# Patient Record
Sex: Male | Born: 1950 | Hispanic: No | Marital: Married | Smoking: Never smoker
Health system: Southern US, Community
[De-identification: ages and names within clinical notes are randomized; demographics above are authoritative.]

## PROBLEM LIST (undated history)

## (undated) DIAGNOSIS — I10 Essential (primary) hypertension: Secondary | ICD-10-CM

## (undated) HISTORY — PX: NECK SURGERY: SHX720

---

## 2017-01-14 ENCOUNTER — Emergency Department (HOSPITAL_BASED_OUTPATIENT_CLINIC_OR_DEPARTMENT_OTHER): Payer: Medicaid Other

## 2017-01-14 ENCOUNTER — Emergency Department (HOSPITAL_BASED_OUTPATIENT_CLINIC_OR_DEPARTMENT_OTHER)
Admission: EM | Admit: 2017-01-14 | Discharge: 2017-01-14 | Disposition: A | Discharge status: Home / Self Care | Payer: Medicaid Other | Attending: Emergency Medicine | Admitting: Emergency Medicine

## 2017-01-14 ENCOUNTER — Encounter (HOSPITAL_BASED_OUTPATIENT_CLINIC_OR_DEPARTMENT_OTHER): Payer: Self-pay

## 2017-01-14 DIAGNOSIS — Y929 Unspecified place or not applicable: Secondary | ICD-10-CM | POA: Diagnosis not present

## 2017-01-14 DIAGNOSIS — Y999 Unspecified external cause status: Secondary | ICD-10-CM | POA: Insufficient documentation

## 2017-01-14 DIAGNOSIS — Y939 Activity, unspecified: Secondary | ICD-10-CM | POA: Diagnosis not present

## 2017-01-14 DIAGNOSIS — S93609A Unspecified sprain of unspecified foot, initial encounter: Secondary | ICD-10-CM

## 2017-01-14 DIAGNOSIS — W19XXXA Unspecified fall, initial encounter: Secondary | ICD-10-CM | POA: Diagnosis not present

## 2017-01-14 DIAGNOSIS — S93692A Other sprain of left foot, initial encounter: Secondary | ICD-10-CM | POA: Diagnosis not present

## 2017-01-14 DIAGNOSIS — S99822A Other specified injuries of left foot, initial encounter: Secondary | ICD-10-CM | POA: Diagnosis present

## 2017-01-14 HISTORY — DX: Essential (primary) hypertension: I10

## 2017-01-14 MED ORDER — NAPROXEN 250 MG PO TABS
500.0000 mg | ORAL_TABLET | Freq: Once | ORAL | Status: AC
  Administered 2017-01-14: 500 mg via ORAL
  Filled 2017-01-14: qty 2

## 2017-01-14 MED ORDER — ACETAMINOPHEN 500 MG PO TABS
1000.0000 mg | ORAL_TABLET | Freq: Once | ORAL | Status: AC
  Administered 2017-01-14: 1000 mg via ORAL
  Filled 2017-01-14: qty 2

## 2017-01-14 MED ORDER — NAPROXEN 375 MG PO TABS: 375.0000 mg | ORAL_TABLET | Freq: Two times a day (BID) | ORAL | 0 refills | Status: DC

## 2017-01-14 NOTE — ED Triage Notes (Signed)
Pt tripped down 2 steps at approx 2100 last night. Pt complaining of left foot pain.

## 2017-01-14 NOTE — ED Notes (Signed)
Patient transported to X-ray 

## 2017-01-14 NOTE — ED Notes (Signed)
ED Provider at bedside. 

## 2017-01-14 NOTE — ED Notes (Signed)
Ice pack applied to left ankle.

## 2017-01-14 NOTE — ED Provider Notes (Signed)
[facility] Provider Note   CSN: [id number] Arrival date & time: 01/14/17  0116     History   Chief Complaint Chief Complaint  Patient presents with  . Foot Pain  . Fall    HPI Alex Villanueva is a 66 y.o. male.  The history is provided by the patient. No language interpreter was used.  Foot Pain  This is a new problem. The current episode started 3 to 5 hours ago. The problem occurs constantly. The problem has not changed since onset.Pertinent negatives include no chest pain, no abdominal pain, no headaches and no shortness of breath. The symptoms are aggravated by walking. Nothing relieves the symptoms. He has tried nothing for the symptoms. The treatment provided no relief.  tripped and twisted left ankle and foot.    No past medical history on file.  There are no active problems to display for this patient.   No past surgical history on file.     Home Medications    Prior to Admission medications   Not on File    Family History No family history on file.  Social History Social History  Substance Use Topics  . Smoking status: Not on file  . Smokeless tobacco: Not on file  . Alcohol use Not on file     Allergies   Patient has no allergy information on record.   Review of Systems Review of Systems  Respiratory: Negative for shortness of breath.   Cardiovascular: Negative for chest pain.  Gastrointestinal: Negative for abdominal pain.  Musculoskeletal: Positive for arthralgias. Negative for back pain and gait problem.  Neurological: Negative for headaches.  All other systems reviewed and are negative.    Physical Exam Updated Vital Signs There were no vitals taken for this visit.  Physical Exam  Constitutional: He is oriented to person, place, and time. He appears well-developed and well-nourished. No distress.  HENT:  Head: Normocephalic and atraumatic.  Nose: Nose normal.  Mouth/Throat: Oropharynx is clear and moist.  Eyes:  Conjunctivae are normal. Pupils are equal, round, and reactive to light.  Neck: Normal range of motion.  Cardiovascular: Normal rate, regular rhythm, normal heart sounds and intact distal pulses.   Pulmonary/Chest: He has no wheezes.  Abdominal: Soft. Bowel sounds are normal. There is no tenderness.  Musculoskeletal: Normal range of motion. He exhibits no edema or deformity.       Left ankle: Normal. Achilles tendon normal.       Left foot: Normal. There is normal range of motion, no tenderness, no bony tenderness, no swelling, normal capillary refill, no crepitus, no deformity and no laceration.  Neurological: He is alert and oriented to person, place, and time.  Skin: Skin is dry. Capillary refill takes less than 2 seconds.  Psychiatric: He has a normal mood and affect.  Nursing note and vitals reviewed.    ED Treatments / Results   Vitals:   01/14/17 0135  BP: (!) 176/97  Pulse: 77  Resp: 18  Temp: 97.9 F (36.6 C)    Radiology  No results found for this or any previous visit. Dg Ankle Complete Left  Result Date: 01/14/2017 CLINICAL DATA:  Tripped down 2 steps, with pain and swelling at the left lateral ankle. Initial encounter. EXAM: LEFT ANKLE COMPLETE - 3+ VIEW COMPARISON:  None. FINDINGS: There is no evidence of fracture or dislocation. The ankle mortise is intact; the interosseous space is within normal limits. No talar tilt or subluxation is seen. A posterior calcaneal  spur is noted. The joint spaces are preserved. No significant soft tissue abnormalities are seen. IMPRESSION: No evidence of fracture or dislocation. Electronically Signed   By: Roanna RaiderJeffery  Chang M.D.   On: 01/14/2017 01:59   Dg Foot Complete Left  Result Date: 01/14/2017 CLINICAL DATA:  Tripped down 2 steps, with left lateral foot pain and swelling. Initial encounter. EXAM: LEFT FOOT - COMPLETE 3+ VIEW COMPARISON:  None. FINDINGS: There is no evidence of fracture or dislocation. The joint spaces are preserved.  There is no evidence of talar subluxation; the subtalar joint is unremarkable in appearance. A posterior calcaneal spur is noted. No significant soft tissue abnormalities are seen. IMPRESSION: No evidence of fracture or dislocation. Electronically Signed   By: Roanna RaiderJeffery  Chang M.D.   On: 01/14/2017 01:58    Procedures Procedures (including critical care time)  Medications Ordered in ED  Medications  naproxen (NAPROSYN) tablet 500 mg (not administered)  acetaminophen (TYLENOL) tablet 1,000 mg (1,000 mg Oral Given 01/14/17 0219)     Final Clinical Impressions(s) / ED Diagnoses  Foot sprain:  Return immediately for fever >101, coldness of the extremity, numbness, intractable pain, weakness, bleeding or any concerns. Follow up with your own doctor for ongoing concerns.    The patient is nontoxic-appearing on exam and vital signs are within normal limits.   I have reviewed the triage vital signs and the nursing notes. Pertinent labs &imaging results that were available during my care of the patient were reviewed by me and considered in my medical decision making (see chart for details).  After history, exam, and medical workup I feel the patient has been appropriately medically screened and is safe for discharge home. Pertinent diagnoses were discussed with the patient. Patient was given return precautions.      Logan Vegh, MD 01/14/17 [id number]

## 2017-02-16 ENCOUNTER — Ambulatory Visit: Payer: Medicaid Other | Admitting: Family Medicine

## 2017-02-16 DIAGNOSIS — I10 Essential (primary) hypertension: Secondary | ICD-10-CM | POA: Insufficient documentation

## 2017-02-16 NOTE — Progress Notes (Deleted)
   Subjective:  Patient ID: Rigoberto NoelMohamed Dembeck, male    DOB: 10/26/1950  Age: 66 y.o. MRN: [id number]  CC: No chief complaint on file.   HPI Rigoberto NoelMohamed Stebner presents for ***  Outpatient Medications Prior to Visit  Medication Sig Dispense Refill  . amLODipine-atorvastatin (CADUET) 5-10 MG tablet Take 1 tablet by mouth daily.    . naproxen (NAPROSYN) 375 MG tablet Take 1 tablet (375 mg total) by mouth 2 (two) times daily. 20 tablet 0   No facility-administered medications prior to visit.     ROS Review of Systems  Constitutional: Negative.   Eyes: Negative.   Respiratory: Negative.   Cardiovascular: Negative.   Gastrointestinal: Negative.   Skin: Negative.     Objective:  There were no vitals taken for this visit.  BP/Weight 01/14/2017  Systolic BP 176  Diastolic BP 97  Wt. (Lbs) 182  BMI 27.67   Physical Exam  Constitutional: He appears well-developed and well-nourished.  Eyes: Conjunctivae are normal. Pupils are equal, round, and reactive to light.  Neck: No JVD present.  Cardiovascular: Normal rate, regular rhythm, normal heart sounds and intact distal pulses.   Pulmonary/Chest: Effort normal and breath sounds normal.  Abdominal: Soft. Bowel sounds are normal.  Skin: Skin is warm and dry.  Nursing note and vitals reviewed.    Assessment & Plan:   Problem List Items Addressed This Visit    None      No orders of the defined types were placed in this encounter.   Follow-up: No Follow-up on file.   Lizbeth BarkMandesia R Tryphena Perkovich FNP

## 2018-09-24 IMAGING — DX DG FOOT COMPLETE 3+V*L*
3 series · 3 of 3 positions shown · non-contrast
Comparison: None.

CLINICAL DATA: Tripped down 2 steps, with left lateral foot pain
and swelling. Initial encounter.

EXAM:
LEFT FOOT - COMPLETE 3+ VIEW

[foot ap]
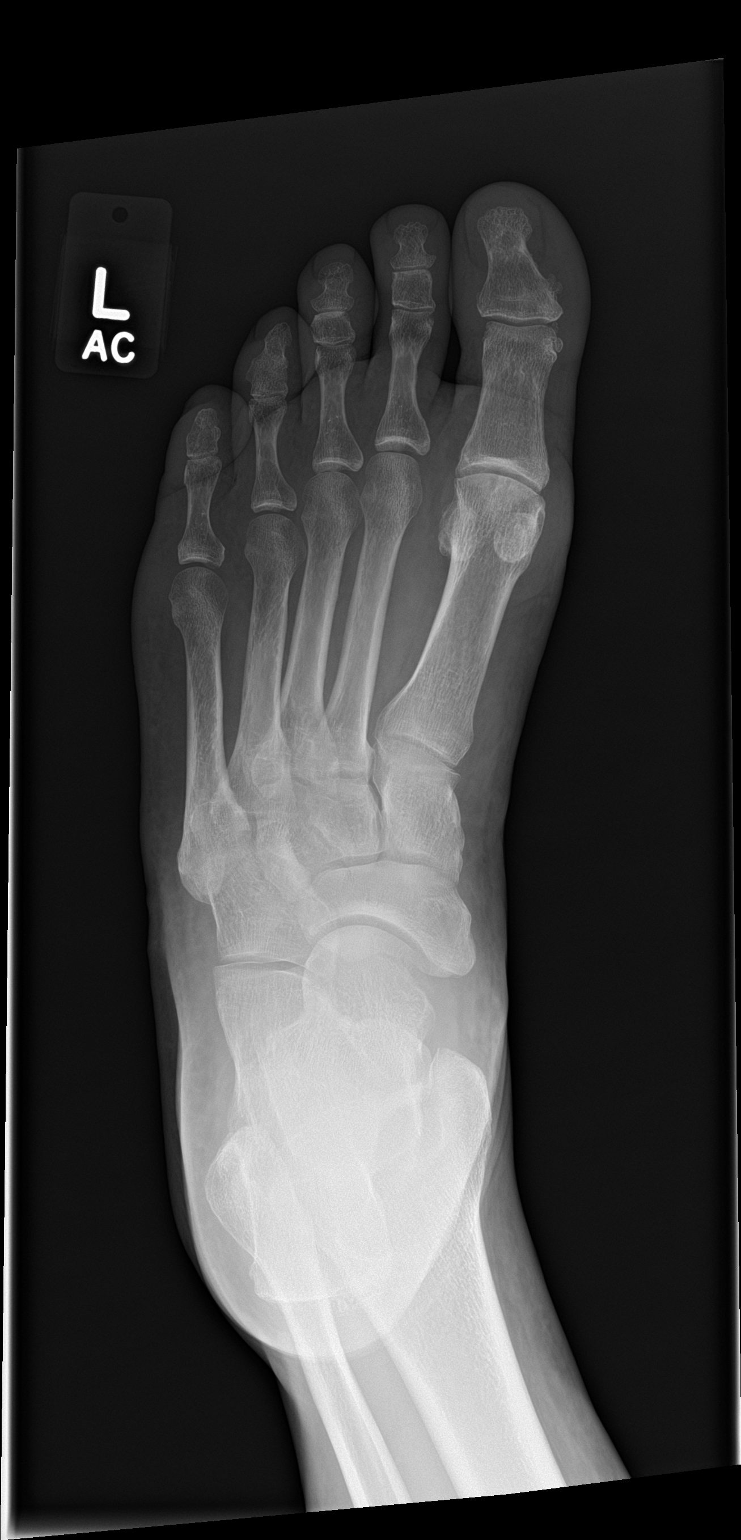

[foot obl]
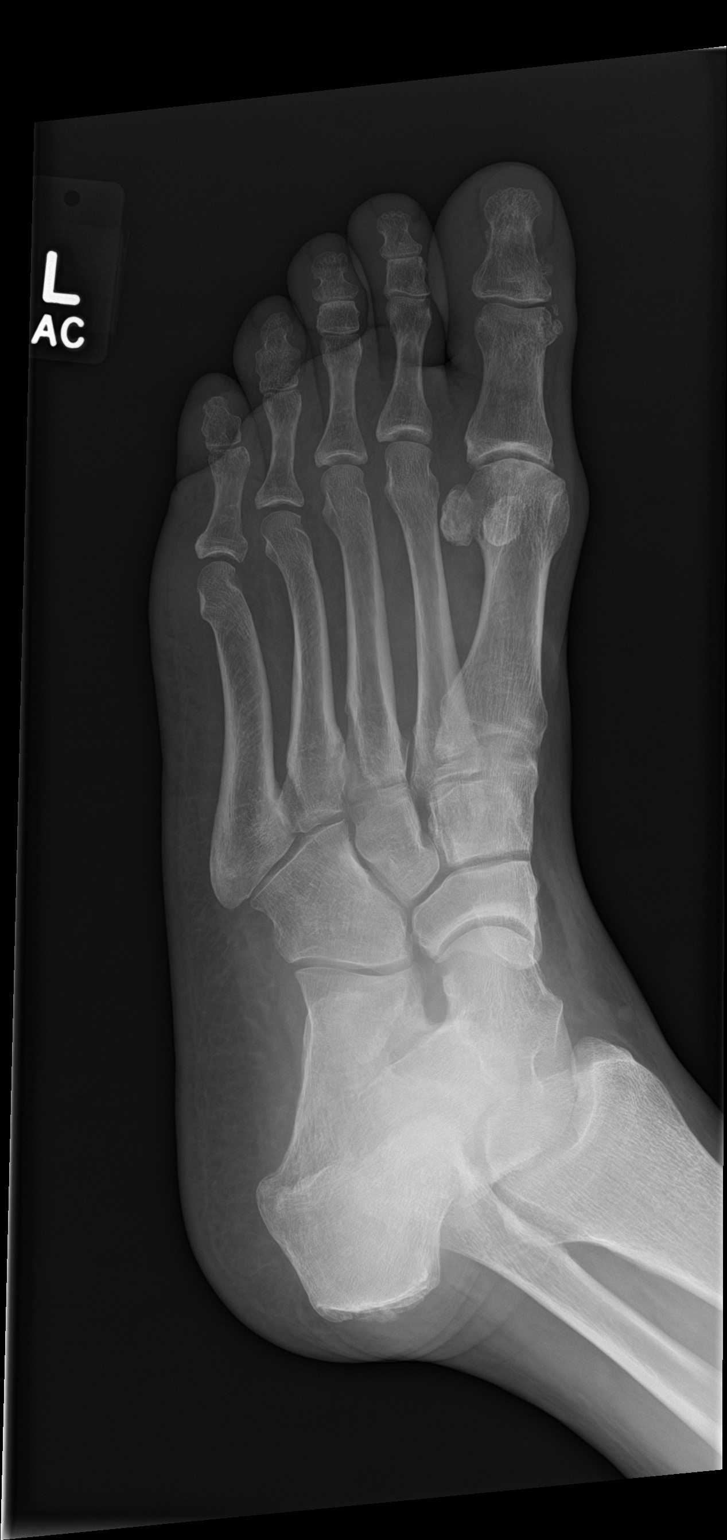

[foot lat]
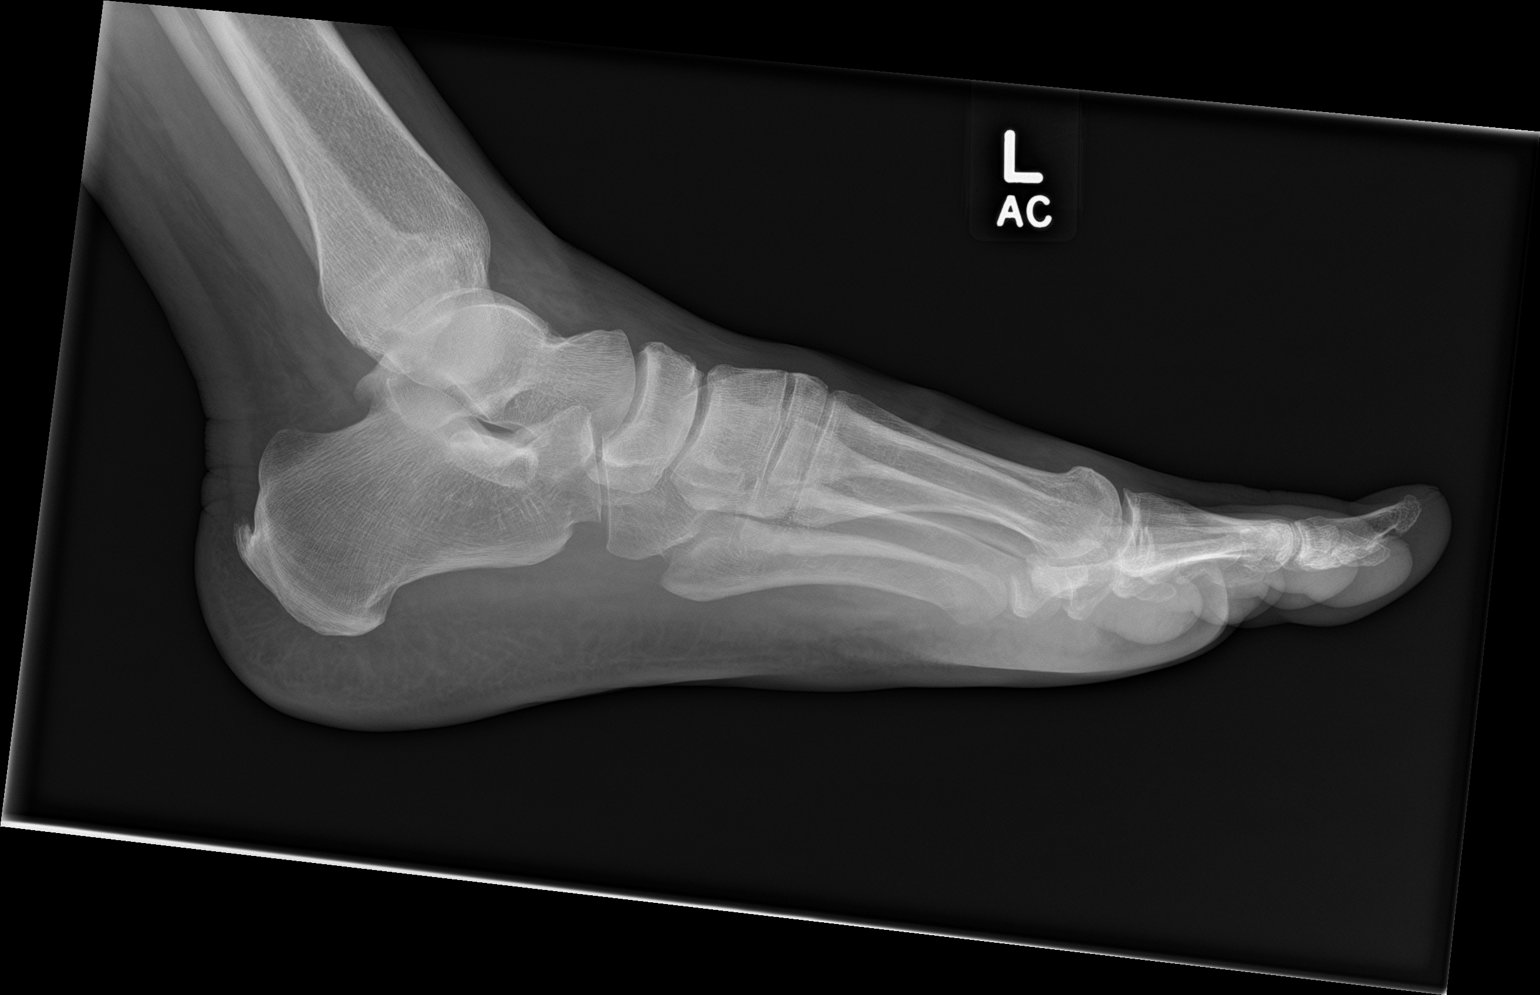

[3 of 3 positions shown; findings below may reference images not displayed]

FINDINGS: There is no evidence of fracture or dislocation. The joint spaces
are preserved. There is no evidence of talar subluxation; the
subtalar joint is unremarkable in appearance. A posterior calcaneal
spur is noted.

No significant soft tissue abnormalities are seen.
IMPRESSION: No evidence of fracture or dislocation.

## 2018-09-24 IMAGING — DX DG ANKLE COMPLETE 3+V*L*
3 series · 3 of 3 positions shown · non-contrast
Comparison: None.

CLINICAL DATA: Tripped down 2 steps, with pain and swelling at the
left lateral ankle. Initial encounter.

EXAM:
LEFT ANKLE COMPLETE - 3+ VIEW

[ankle ap]
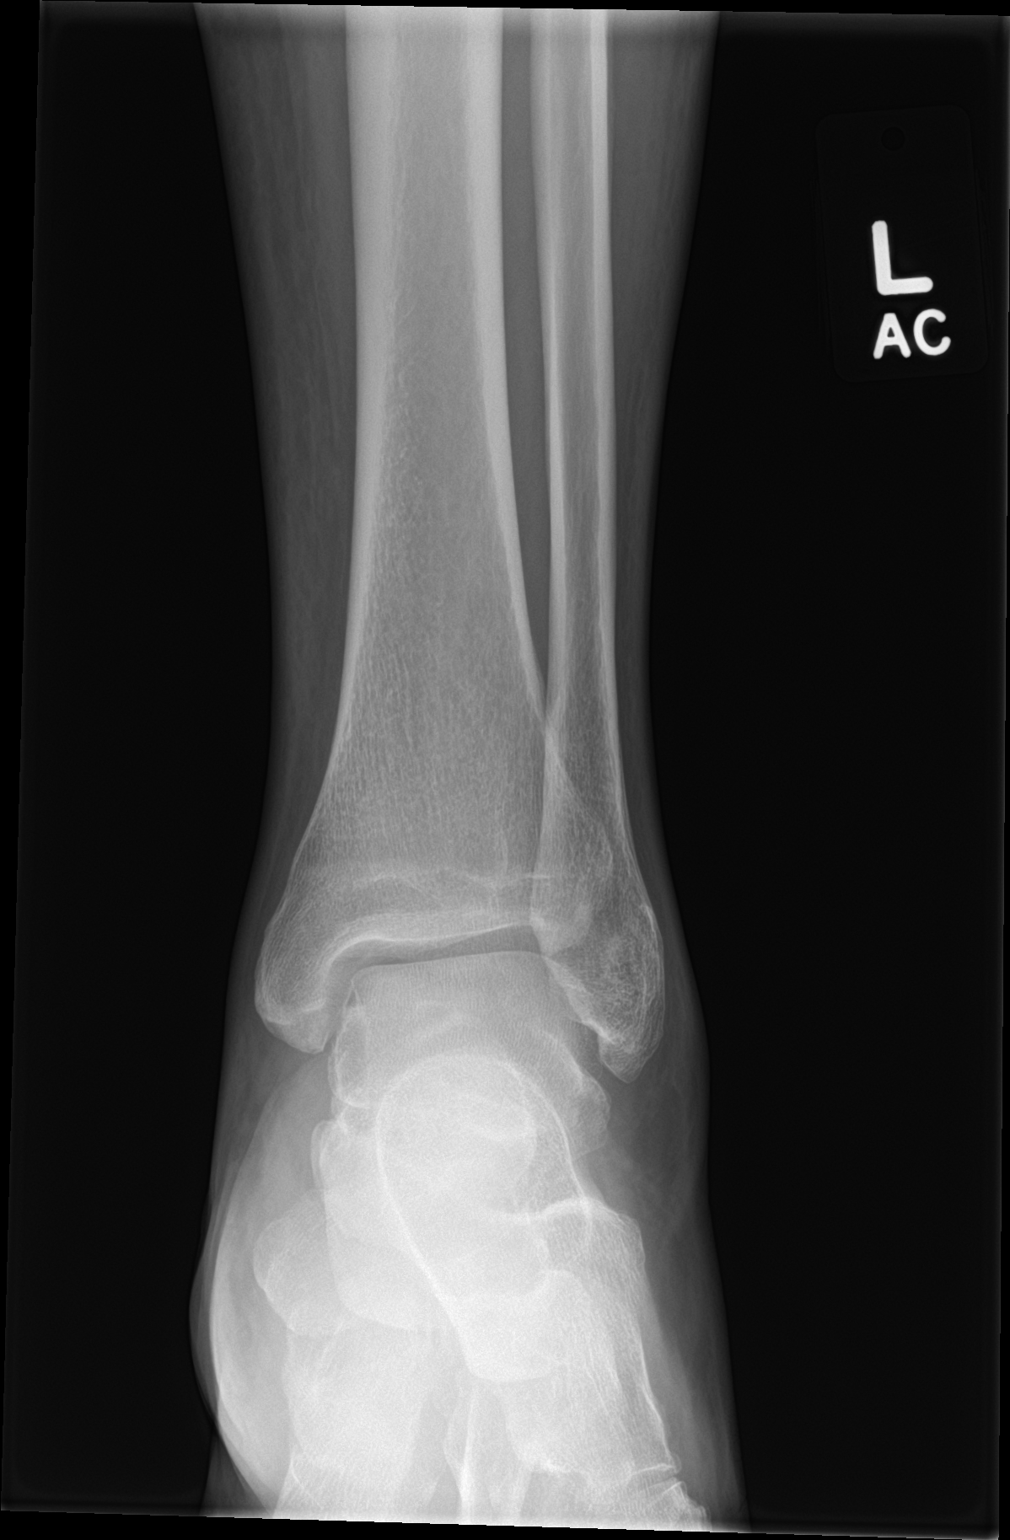

[ankle obl]
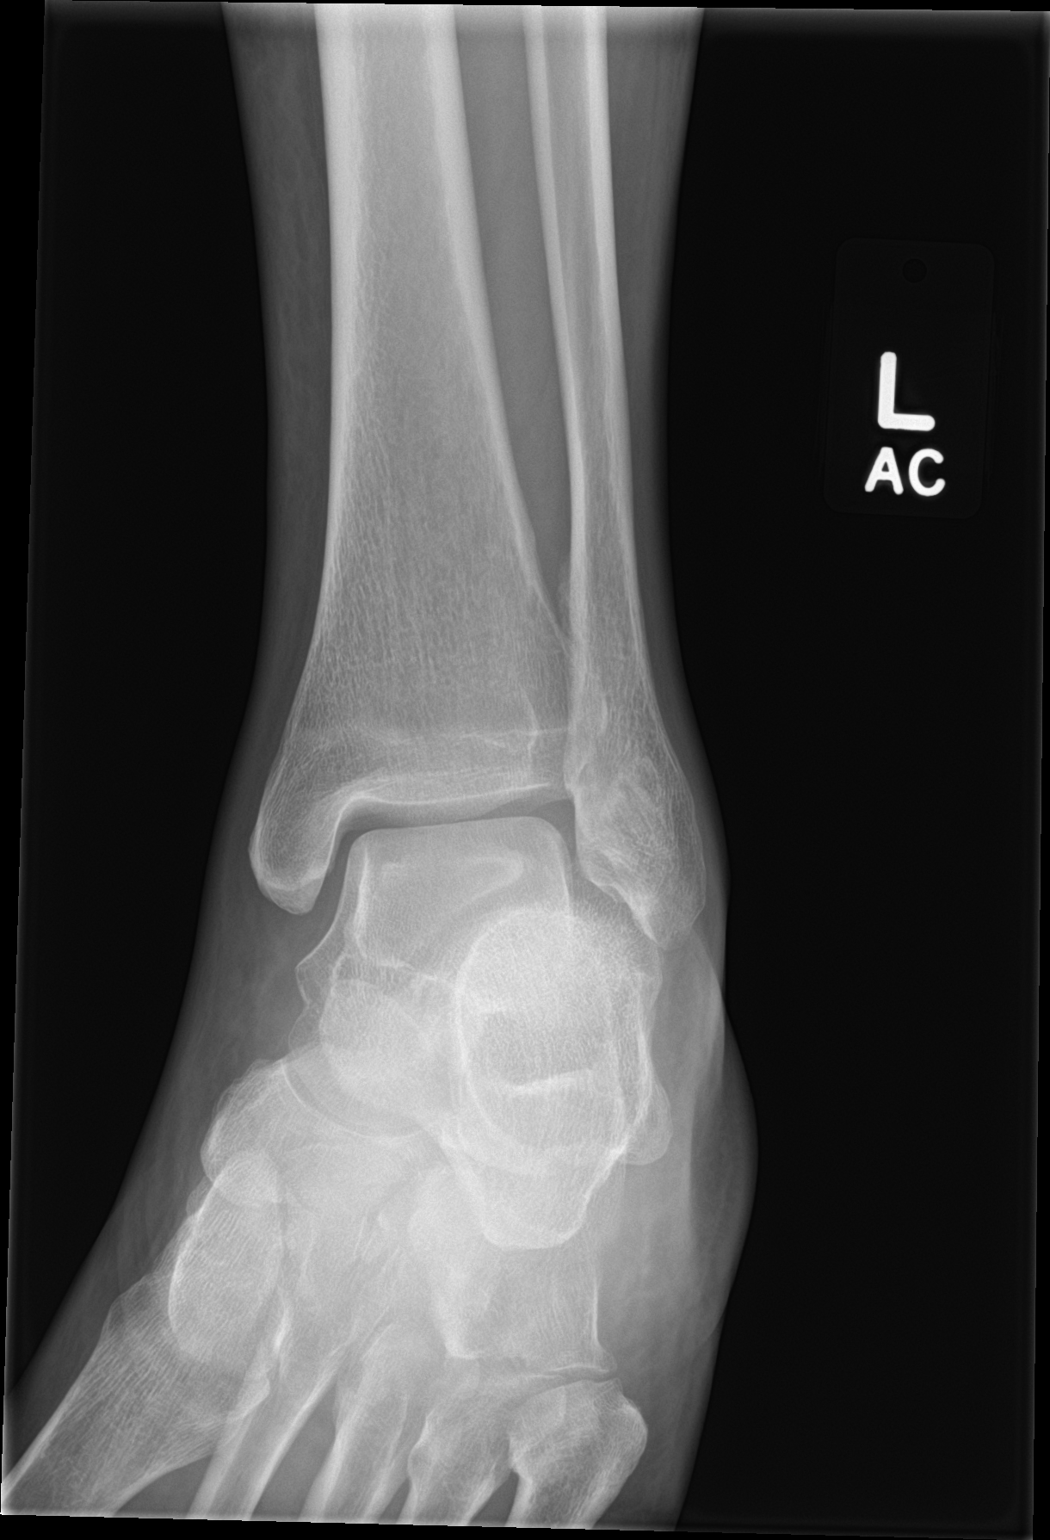

[ankle lat]
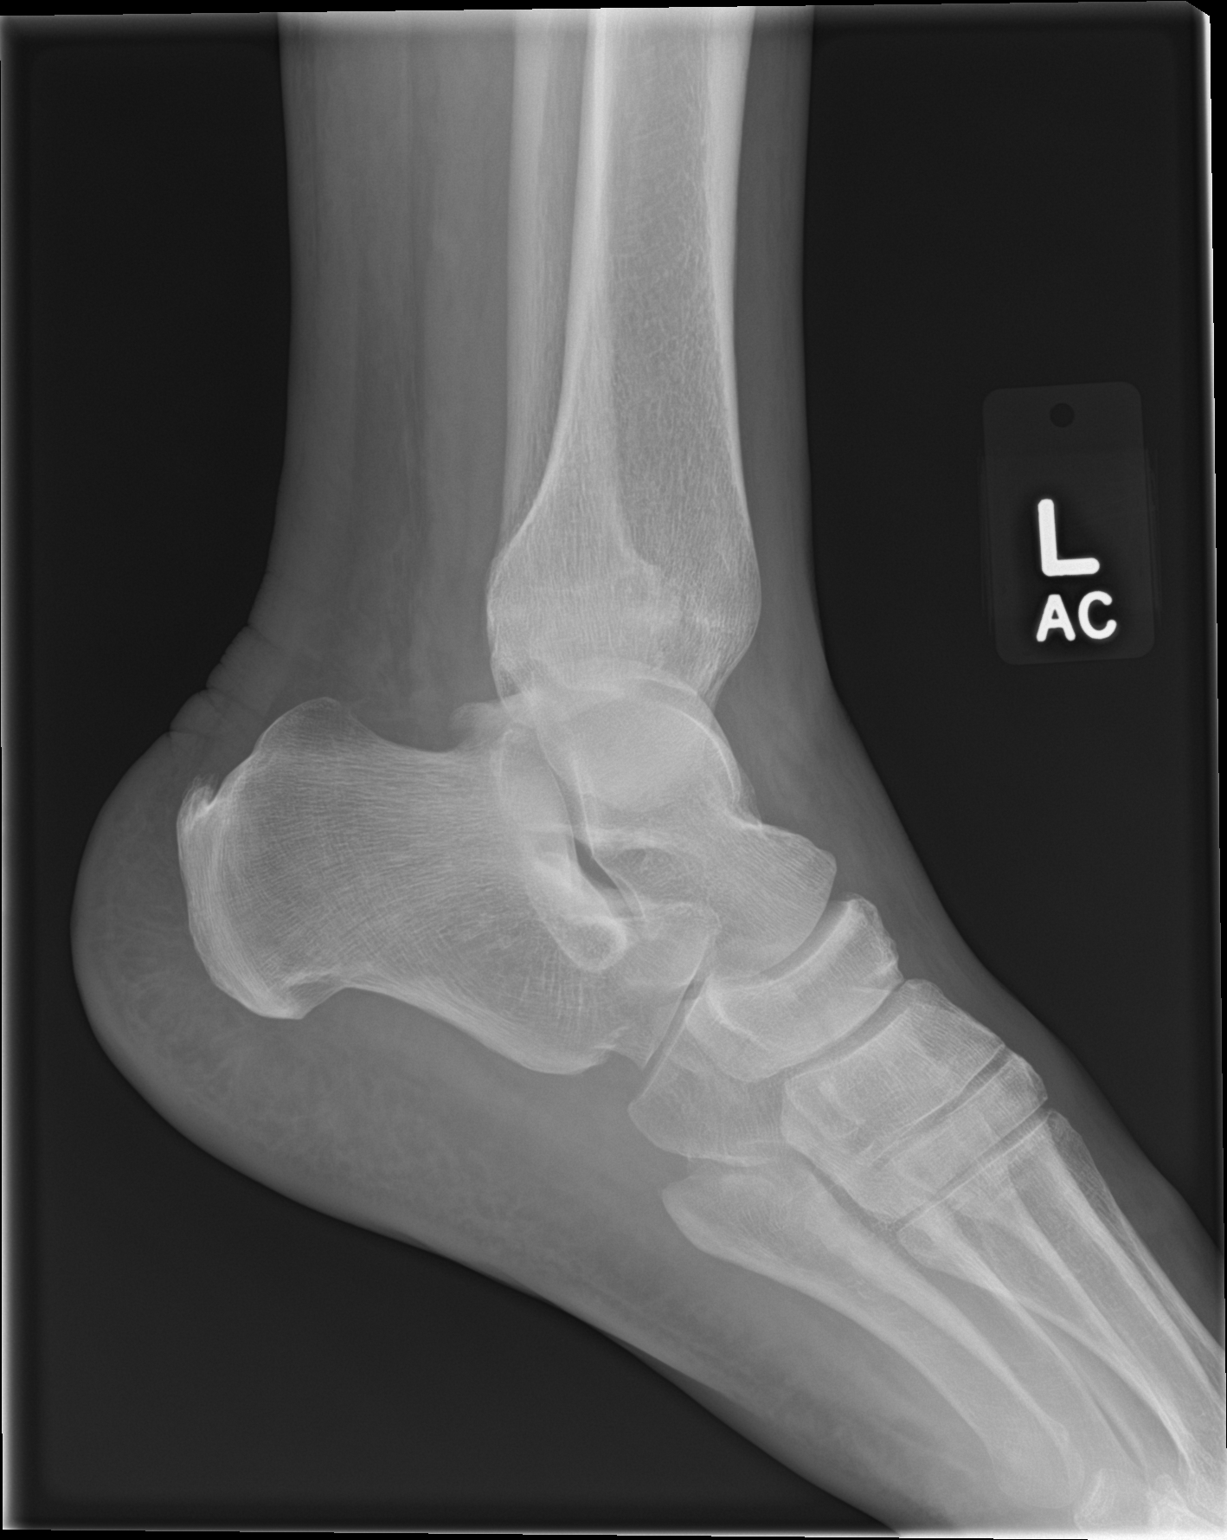

[3 of 3 positions shown; findings below may reference images not displayed]

FINDINGS: There is no evidence of fracture or dislocation. The ankle mortise
is intact; the interosseous space is within normal limits. No talar
tilt or subluxation is seen. A posterior calcaneal spur is noted.

The joint spaces are preserved. No significant soft tissue
abnormalities are seen.
IMPRESSION: No evidence of fracture or dislocation.

## 2019-06-26 ENCOUNTER — Encounter (HOSPITAL_COMMUNITY): Payer: Self-pay | Admitting: Emergency Medicine

## 2019-06-26 ENCOUNTER — Other Ambulatory Visit: Payer: Self-pay

## 2019-06-26 ENCOUNTER — Ambulatory Visit (HOSPITAL_COMMUNITY)
Admission: EM | Admit: 2019-06-26 | Discharge: 2019-06-26 | Disposition: A | Discharge status: Home / Self Care | Payer: Self-pay | Attending: Family Medicine | Admitting: Family Medicine

## 2019-06-26 DIAGNOSIS — I10 Essential (primary) hypertension: Secondary | ICD-10-CM

## 2019-06-26 DIAGNOSIS — R002 Palpitations: Secondary | ICD-10-CM

## 2019-06-26 MED ORDER — AMLODIPINE-ATORVASTATIN 5-10 MG PO TABS: 1.0000 | ORAL_TABLET | Freq: Every day | ORAL | 1 refills | Status: DC

## 2019-06-26 NOTE — ED Triage Notes (Addendum)
Patient is having a headache.  Patient reports being out of medicine for 3 days.  Chest heavy and palpitations

## 2019-06-27 MED FILL — AMLODIPINE-ATORVAST 5-10 MG: 5-10 | 30 days supply | Qty: 30 | Fill #0

## 2019-06-27 NOTE — ED Provider Notes (Signed)
Alex Villanueva   [id number] 06/[birth year] Arrival Time: 3825  ASSESSMENT & PLAN:  1. Uncontrolled hypertension   2. Palpitations     ECG interpreted by me: no signs of ischemia/STEMI. He insists symptoms of palpations and chest heaviness are "Just in my head. Once I start my blood pressure medicine they always go away." Declines ED workup/evaluation. Discussed CP precautions.  Meds ordered this encounter  Medications  . amLODipine-atorvastatin (CADUET) 5-10 MG tablet    Sig: Take 1 tablet by mouth daily.    Dispense:  30 tablet    Refill:  1    Follow-up Information    [facility].   Specialty: Emergency Medicine Why: If symptoms worsen in any way. Contact information: [address] [phone] [place] [phone]          Reviewed expectations re: course of current medical issues. Questions answered. Outlined signs and symptoms indicating need for more acute intervention. Patient verbalized understanding. After Visit Summary given.   SUBJECTIVE:  Alex Villanueva is a 68 y.o. male who presents with concerns regarding increased blood pressures. He reports being out of his BP medication for 3-4 days. "When I stop taking it I feel palpitations and some chest heaviness. But when I start it again I feel fine". This pattern has repeated in the past. No active CP or palpitations or SOB currently. Without associated n/v. Without back pain. No specific aggravating or alleviating factors reported when symptoms present. Normal PO intake. No associated abdominal pain. Normal vision. Without headaches. No extremity sensation changes or weakness.   He reports no TIA's, no chest pain on exertion, no dyspnea on exertion, no swelling of ankles, no orthostatic dizziness or lightheadedness, no orthopnea or paroxysmal nocturnal dyspnea and noting orthopnea.  Denies symptoms of chest pain, palpations, orthopnea,  nocturnal dyspnea, or LE edema.  Social History   Tobacco Use  Smoking Status Never Smoker  Smokeless Tobacco Never Used   ROS: As per HPI. All other systems negative.    OBJECTIVE:  Vitals:   06/26/19 1844  BP: (!) 185/83  Pulse: 65  Resp: 18  Temp: 98 F (36.7 C)  TempSrc: Other (Comment)  SpO2: 99%    General appearance: alert; no distress Eyes: PERRLA; EOMI HENT: normocephalic; atraumatic Neck: supple Lungs: clear to auscultation bilaterally; speaks full sentences without difficulty Heart: regular rate and rhythm without murmer Chest Wall: non-tender to palpation Abdomen: soft, non-tender; bowel sounds normal Extremities: no edema; symmetrical with no gross deformities Skin: warm and dry Psychological: alert and cooperative; normal mood and affect    No Known Allergies  Past Medical History:  Diagnosis Date  . Hypertension    Social History   Socioeconomic History  . Marital status: Married    Spouse name: Not on file  . Number of children: Not on file  . Years of education: Not on file  . Highest education level: Not on file  Occupational History  . Not on file  Social Needs  . Financial resource strain: Not on file  . Food insecurity    Worry: Not on file    Inability: Not on file  . Transportation needs    Medical: Not on file    Non-medical: Not on file  Tobacco Use  . Smoking status: Never Smoker  . Smokeless tobacco: Never Used  Substance and Sexual Activity  . Alcohol use: No  . Drug use: No  . Sexual activity: Never  Lifestyle  .  Physical activity    Days per week: Not on file    Minutes per session: Not on file  . Stress: Not on file  Relationships  . Social Musician on phone: Not on file    Gets together: Not on file    Attends religious service: Not on file    Active member of club or organization: Not on file    Attends meetings of clubs or organizations: Not on file    Relationship status: Not on file  .  Intimate partner violence    Fear of current or ex partner: Not on file    Emotionally abused: Not on file    Physically abused: Not on file    Forced sexual activity: Not on file  Other Topics Concern  . Not on file  Social History Narrative  . Not on file   FH: HTN  Past Surgical History:  Procedure Laterality Date  . NECK SURGERY        Mardella Layman, MD 06/27/19 1024

## 2019-07-16 ENCOUNTER — Other Ambulatory Visit: Payer: Self-pay

## 2019-07-16 DIAGNOSIS — Z20822 Contact with and (suspected) exposure to covid-19: Secondary | ICD-10-CM

## 2019-07-17 LAB — NOVEL CORONAVIRUS, NAA: SARS-CoV-2, NAA: DETECTED — AB

## 2019-08-07 MED FILL — AMLODIPINE-ATORVAST 5-10 MG: 5-10 | 30 days supply | Qty: 30 | Fill #1

## 2019-08-31 ENCOUNTER — Ambulatory Visit: Payer: Self-pay | Attending: Internal Medicine

## 2019-08-31 DIAGNOSIS — Z20822 Contact with and (suspected) exposure to covid-19: Secondary | ICD-10-CM | POA: Insufficient documentation

## 2019-09-02 LAB — NOVEL CORONAVIRUS, NAA: SARS-CoV-2, NAA: NOT DETECTED

## 2021-08-23 HISTORY — PX: CARPAL TUNNEL RELEASE: SHX101

## 2023-10-31 MED ORDER — CHLORHEXIDINE GLUCONATE 0.12 % MT SOLN
OROMUCOSAL | 0 refills | Status: DC
  Filled 2023-10-31: qty 473, 10d supply, fill #0

## 2023-10-31 MED ORDER — AMOXICILLIN 500 MG PO CAPS
500.0000 mg | ORAL_CAPSULE | Freq: Three times a day (TID) | ORAL | 0 refills | Status: DC
  Filled 2023-10-31: qty 21, 7d supply, fill #0

## 2023-10-31 MED ORDER — IBUPROFEN 800 MG PO TABS
800.0000 mg | ORAL_TABLET | Freq: Three times a day (TID) | ORAL | 1 refills | Status: DC | PRN
  Filled 2023-10-31: qty 20, 10d supply, fill #0

## 2024-05-18 ENCOUNTER — Encounter: Payer: Self-pay | Admitting: Internal Medicine

## 2024-05-18 ENCOUNTER — Other Ambulatory Visit (HOSPITAL_BASED_OUTPATIENT_CLINIC_OR_DEPARTMENT_OTHER): Payer: Self-pay

## 2024-05-18 ENCOUNTER — Ambulatory Visit (INDEPENDENT_AMBULATORY_CARE_PROVIDER_SITE_OTHER): Payer: Self-pay | Admitting: Internal Medicine

## 2024-05-18 ENCOUNTER — Ambulatory Visit (INDEPENDENT_AMBULATORY_CARE_PROVIDER_SITE_OTHER): Payer: Self-pay

## 2024-05-18 VITALS — BP 130/78 | HR 78 | Temp 98.0°F | Ht 68.0 in | Wt 213.8 lb

## 2024-05-18 DIAGNOSIS — E669 Obesity, unspecified: Secondary | ICD-10-CM

## 2024-05-18 DIAGNOSIS — R3912 Poor urinary stream: Secondary | ICD-10-CM

## 2024-05-18 DIAGNOSIS — R109 Unspecified abdominal pain: Secondary | ICD-10-CM

## 2024-05-18 DIAGNOSIS — K5909 Other constipation: Secondary | ICD-10-CM

## 2024-05-18 DIAGNOSIS — M545 Low back pain, unspecified: Secondary | ICD-10-CM

## 2024-05-18 DIAGNOSIS — K5790 Diverticulosis of intestine, part unspecified, without perforation or abscess without bleeding: Secondary | ICD-10-CM

## 2024-05-18 DIAGNOSIS — N401 Enlarged prostate with lower urinary tract symptoms: Secondary | ICD-10-CM

## 2024-05-18 DIAGNOSIS — I10 Essential (primary) hypertension: Secondary | ICD-10-CM

## 2024-05-18 LAB — COMPREHENSIVE METABOLIC PANEL WITH GFR
ALT: 11 U/L (ref 0–53)
AST: 13 U/L (ref 0–37)
Albumin: 4.2 g/dL (ref 3.5–5.2)
Alkaline Phosphatase: 57 U/L (ref 39–117)
BUN: 17 mg/dL (ref 6–23)
CO2: 27 meq/L (ref 19–32)
Calcium: 8.9 mg/dL (ref 8.4–10.5)
Chloride: 105 meq/L (ref 96–112)
Creatinine, Ser: 0.95 mg/dL (ref 0.40–1.50)
GFR: 79.37 mL/min (ref >60.00)
Glucose, Bld: 107 mg/dL — ABNORMAL HIGH (ref 70–99)
Potassium: 4 meq/L (ref 3.5–5.1)
Sodium: 139 meq/L (ref 135–145)
Total Bilirubin: 0.6 mg/dL (ref 0.2–1.2)
Total Protein: 7 g/dL (ref 6.0–8.3)

## 2024-05-18 LAB — CBC WITH DIFFERENTIAL/PLATELET
Basophils Absolute: 0 K/uL (ref 0.0–0.1)
Basophils Relative: 0.7 % (ref 0.0–3.0)
Eosinophils Absolute: 0.1 K/uL (ref 0.0–0.7)
Eosinophils Relative: 2.3 % (ref 0.0–5.0)
HCT: 37.6 % — ABNORMAL LOW (ref 39.0–52.0)
Hemoglobin: 12.6 g/dL — ABNORMAL LOW (ref 13.0–17.0)
Lymphocytes Relative: 31.9 % (ref 12.0–46.0)
Lymphs Abs: 1.3 K/uL (ref 0.7–4.0)
MCHC: 33.6 g/dL (ref 30.0–36.0)
MCV: 78.3 fl (ref 78.0–100.0)
Monocytes Absolute: 0.5 K/uL (ref 0.1–1.0)
Monocytes Relative: 11.5 % (ref 3.0–12.0)
Neutro Abs: 2.1 K/uL (ref 1.4–7.7)
Neutrophils Relative %: 53.6 % (ref 43.0–77.0)
Platelets: 265 K/uL (ref 150.0–400.0)
RBC: 4.8 Mil/uL (ref 4.22–5.81)
RDW: 13.6 % (ref 11.5–15.5)
WBC: 3.9 K/uL — ABNORMAL LOW (ref 4.0–10.5)

## 2024-05-18 LAB — LIPID PANEL
Cholesterol: 136 mg/dL (ref 0–200)
HDL: 33.5 mg/dL — ABNORMAL LOW (ref >39.00)
LDL Cholesterol: 86 mg/dL (ref 0–99)
NonHDL: 102.03
Total CHOL/HDL Ratio: 4
Triglycerides: 79 mg/dL (ref 0.0–149.0)
VLDL: 15.8 mg/dL (ref 0.0–40.0)

## 2024-05-18 LAB — PSA: PSA: 1.97 ng/mL (ref 0.10–4.00)

## 2024-05-18 LAB — LIPASE: Lipase: 58 U/L (ref 11.0–59.0)

## 2024-05-18 LAB — AMYLASE: Amylase: 26 U/L — ABNORMAL LOW (ref 27–131)

## 2024-05-18 MED ORDER — IBUPROFEN 800 MG PO TABS
800.0000 mg | ORAL_TABLET | Freq: Three times a day (TID) | ORAL | 0 refills | Status: AC | PRN
  Filled 2024-05-18: qty 30, 10d supply, fill #0

## 2024-05-18 MED ORDER — TRAMADOL HCL 50 MG PO TABS
50.0000 mg | ORAL_TABLET | Freq: Three times a day (TID) | ORAL | 0 refills | Status: AC | PRN
  Filled 2024-05-18: qty 15, 5d supply, fill #0

## 2024-05-18 NOTE — Assessment & Plan Note (Signed)
 Diverticular disease was [place] than ten [place] ago. Current symptoms of left flank pain may be related to diverticular disease, possibly a ruptured diverticula, although no fever or systemic signs of infection are present.

## 2024-05-18 NOTE — Patient Instructions (Addendum)
 It was a pleasure seeing you today! Your health and satisfaction are our top priorities.  Alex Cone, MD  VISIT SUMMARY: Today, you were seen for back and flank pain that you have been experiencing for several weeks. We discussed your history of hypertension, diverticular disease, and other health concerns. A thorough evaluation was conducted to determine the cause of your pain, and a plan was made to address your symptoms and manage your overall health.  YOUR PLAN: -LEFT FLANK PAIN AND CHRONIC BACK PAIN: Your back and flank pain may be due to diverticular disease or kidney stones. We will perform a CT scan of your abdomen and pelvis with contrast to investigate further. Blood tests to check your pancreas and kidney function, as well as an x-ray of your abdomen and spine, have been ordered. For pain relief, you have been prescribed ibuprofen  and tramadol . If your pain becomes severe, please go to the emergency room.  -DIVERTICULAR DISEASE OF COLON: Diverticular disease involves small pouches in the colon that can become inflamed or infected. Your current left flank pain may be related to this condition. We are investigating this with a CT scan and blood tests.  -CHRONIC CONSTIPATION: Chronic constipation is when you have infrequent or difficult bowel movements. You are currently managing this with Dulcolax, and no changes are needed at this time.  -BENIGN PROSTATIC HYPERPLASIA WITH LOWER URINARY TRACT SYMPTOMS: Benign prostatic hyperplasia is an enlarged prostate that can cause urinary symptoms like a weak stream and frequent urination at night. You are managing this with Flomax, and we will check your PSA level during your blood work.  -ESSENTIAL HYPERTENSION: Essential hypertension is high blood pressure without a known cause. You are managing this with amlodipine , and no changes are needed at this time.  -GENERAL HEALTH MAINTENANCE: We will check your cholesterol levels as part of your routine  health maintenance.  INSTRUCTIONS: Please complete the CT scan of your abdomen and pelvis with contrast as soon as possible. Additionally, have the blood tests and x-ray done as ordered. Continue taking your prescribed medications, and use ibuprofen  and tramadol  for pain relief as needed. If your pain becomes severe, go to the emergency room. Follow up with us  after your tests are completed to discuss the results and next steps.  Your Providers PCP: Villanueva Alex MATSU, MD,  [phone])  NEXT STEPS: [x]  Early Intervention: Schedule sooner appointment, call our on-call services, or go to emergency room if there is any significant Increase in pain or discomfort New or worsening symptoms Sudden or severe changes in your health [x]  Flexible Follow-Up: We recommend a No follow-ups on file. for optimal routine care. This allows for progress monitoring and treatment adjustments. [x]  Preventive Care: Schedule your annual preventive care visit! It's typically covered by insurance and helps identify potential health issues early. [x]  Lab & X-ray Appointments: Incomplete tests scheduled today, or call to schedule. X-rays: [facility] (M-F, 8:30am-noon or 1pm-5pm). [x]  Medical Information Release: Sign a release form at front desk to obtain relevant medical information we don't have.  MAKING THE MOST OF OUR FOCUSED 20 MINUTE APPOINTMENTS: [x]   Clearly state your top concerns at the beginning of the visit to focus our discussion [x]   If you anticipate you will need more time, please inform the front desk during scheduling - we can book multiple appointments in the same week. [x]   If you have transportation problems- use our convenient video appointments or ask about transportation support. [x]   We can  get down to business faster if you use MyChart to update information before the visit and submit non-urgent questions before your visit. Thank you for taking the time to provide details  through MyChart.  Let our nurse know and she can import this information into your encounter documents.  Arrival and Wait Times: [x]   Arriving on time ensures that everyone receives prompt attention. [x]   Early morning (8a) and afternoon (1p) appointments tend to have shortest wait times. [x]   Unfortunately, we cannot delay appointments for late arrivals or hold slots during phone calls.  Getting Answers and Following Up [x]   Simple Questions & Concerns: For quick questions or basic follow-up after your visit, reach us  at [phone] or MyChart messaging. [x]   Complex Concerns: If your concern is more complex, scheduling an appointment might be best. Discuss this with the staff to find the most suitable option. [x]   Lab & Imaging Results: We'll contact you directly if results are abnormal or you don't use MyChart. Most normal results will be on MyChart within 2-3 business days, with a review message from Dr. Jesus. Haven't heard back in 2 weeks? Need results sooner? Contact us  at [phone]. [x]   Referrals: Our referral coordinator will manage specialist referrals. The specialist's office should contact you within 2 weeks to schedule an appointment. Call us  if you haven't heard from them after 2 weeks.  Staying Connected [x]   MyChart: Activate your MyChart for the fastest way to access results and message us . See the last page of this paperwork for instructions on how to activate.  Bring to Your Next Appointment [x]   Medications: Please bring all your medication bottles to your next appointment to ensure we have an accurate record of your prescriptions. [x]   Health Diaries: If you're monitoring any health conditions at home, keeping a diary of your readings can be very helpful for discussions at your next appointment.  Billing [x]   X-ray & Lab Orders: These are billed by separate companies. Contact the invoicing company directly for questions or concerns. [x]   Visit Charges: Discuss  any billing inquiries with our administrative services team.  Your Satisfaction Matters [x]   Share Your Experience: We strive for your satisfaction! If you have any complaints, or preferably compliments, please let Dr. Jesus know directly or contact our Practice Administrators, Manuelita Rubin or [employer], by asking at the front desk.   Reviewing Your Records [x]   Review this early draft of your clinical encounter notes below and the final encounter summary tomorrow on MyChart after its been completed.  All orders placed so far are visible here: Left flank pain -     CBC with Differential/Platelet -     Comprehensive metabolic panel with GFR -     CT ABDOMEN PELVIS W CONTRAST; Future -     DG Abd 1 View; Future -     Amylase -     Lipase -     Ibuprofen ; Take 1 tablet (800 mg total) by mouth every 8 (eight) hours as needed.  Dispense: 30 tablet; Refill: 0 -     traMADol  HCl; Take 1 tablet (50 mg total) by mouth every 8 (eight) hours as needed for up to 5 days.  Dispense: 15 tablet; Refill: 0  Benign prostatic hyperplasia with weak urinary stream -     PSA  Hypertension, unspecified type  Obesity due to energy imbalance -     Lipid panel  Chronic constipation  Diverticular disease  Lumbar back pain -  DG Lumbar Spine Complete; Future                   [email or web address] [facility] [address], [place], [place] [address] [phone]

## 2024-05-18 NOTE — Assessment & Plan Note (Signed)
 Long-standing hypertension is managed with amlodipine .

## 2024-05-18 NOTE — Assessment & Plan Note (Signed)
 Left flank pain and chronic back pain   He has experienced left flank pain for two weeks, continuous and tolerable, without radiation to the groin. Chronic back pain has persisted for four to five weeks. Differential diagnosis includes diverticular disease and nephrolithiasis. The pain is not aggravated by movement or palpation, suggesting a non-musculoskeletal cause. There is no fever or systemic symptoms, and pain severity ranges from 3 to 6 out of 10. A ruptured diverticula is considered, but no fever or systemic signs of infection are present. Order a CT scan of the abdomen and pelvis with contrast to evaluate for diverticular disease or nephrolithiasis, with a stat order to expedite. Order blood work including pancreas and kidney function tests, and an x-ray of the abdomen and spine. Prescribe prescription strength ibuprofen  and tramadol  for pain management. Advise him to go to the ER if pain becomes severe.

## 2024-05-18 NOTE — Assessment & Plan Note (Signed)
 Chronic constipation is managed with daily Dulcolax. No changes in bowel habits are reported in relation to current symptoms.

## 2024-05-18 NOTE — Assessment & Plan Note (Signed)
 Shared decision-making done; patient understood risks and agreed to X-ray.

## 2024-05-18 NOTE — Progress Notes (Signed)
 [facility]  Phone: [phone]  - Medical Office Visit -  Visit Date: 05/18/2024 Patient: Alex Villanueva   DOB: 1950/11/27   73 y.o. Male  MRN: [id number] Patient Care Team: Jesus Bernardino MATSU, MD as PCP - General (Internal Medicine) Today's Health Care Provider: Bernardino MATSU Jesus, MD  ===========================================    Chief Complaint / Reason for Visit: New Pt (Pt is present to est care ) and Back Pain (Pt has been having a lot of back pain and left side pain 2 weeks )   Background: 73 y.o. 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  Discussed the use of AI scribe software for clinical note transcription with the patient, who gave verbal consent to proceed.  History of Present Illness 73 year old male with hypertension and diverticular disease who presents with back and flank pain.  He has been experiencing back pain for approximately four to five weeks and flank pain for about two to three weeks. The flank pain is a continuous, pressing sensation on the left side near the kidney. The pain does not radiate to the groin or down the leg and remains localized. It is tolerable, rated between five to six at its worst, and improves slightly when he sleeps on his right side. No aggravating factors are noted, and he has not taken any medication for this pain.  He has a history of hypertension for nearly twenty years, managed with amlodipine  5 mg daily. He also has an enlarged prostate for about ten years, causing urinary symptoms such as a weak stream and nocturia once per night, managed with Flomax. He reports chronic constipation and a history of diverticular disease, identified during [place] less than ten [place] ago. He takes two tablets of Dulcolax daily to aid  with bowel movements. No recent fever or significant illness, although he has had recurrent upper respiratory infections in the past two months.  He mentions a sensation of heaviness in his legs, particularly the foot, which began after the onset of his back pain. No shooting pain down the leg or changes in skin sensation. He has a history of sciatica but does not currently experience pain radiating down the leg.  His current medications include amlodipine  5 mg daily, Dulcolax 5 mg daily, and Flomax. He is not currently using chlorhexidine  mouthwash or ibuprofen .   Problem overviews updated today: Problem  Benign Prostatic Hyperplasia With Weak Urinary Stream  Obesity Due to Energy Imbalance  Chronic Constipation  Diverticular Disease  Left Flank Pain  Lumbar Back Pain    Medications updated/reviewed: Current Outpatient Medications on File Prior to Visit  Medication Sig   amLODipine  (NORVASC) 5 MG tablet Take 5 mg by mouth daily.   bisacodyl (DULCOLAX) 5 MG EC tablet Take 5 mg by mouth daily as needed for moderate constipation.   tamsulosin (FLOMAX) 0.4 MG CAPS capsule Take 0.4 mg by mouth.   No current facility-administered medications on file prior to visit.   Medications Discontinued During This Encounter  Medication Reason   amLODipine -atorvastatin  (CADUET ) 5-10 MG tablet    amoxicillin  (AMOXIL ) 500 MG capsule    chlorhexidine  (PERIDEX ) 0.12 % solution    ibuprofen  (ADVIL ) 800 MG tablet    Current Meds  Medication Sig   amLODipine  (NORVASC) 5 MG tablet Take 5 mg by mouth daily.   bisacodyl (  DULCOLAX) 5 MG EC tablet Take 5 mg by mouth daily as needed for moderate constipation.   ibuprofen  (ADVIL ) 800 MG tablet Take 1 tablet (800 mg total) by mouth every 8 (eight) hours as needed.   tamsulosin (FLOMAX) 0.4 MG CAPS capsule Take 0.4 mg by mouth.   traMADol  (ULTRAM ) 50 MG tablet Take 1 tablet (50 mg total) by mouth every 8 (eight) hours as needed for up to 5 days.    [DISCONTINUED] ibuprofen  (ADVIL ) 800 MG tablet Take 1 tablet by mouth every 8 hours as needed for pain    Allergies:  Patient has no known allergies. Past Medical History:  has a past medical history of Hypertension. Past Surgical History:   has a past surgical history that includes Neck surgery and Carpal tunnel release (Right, 2023). Social History:   reports that he has never smoked. He has never used smokeless tobacco. He reports that he does not drink alcohol and does not use drugs. Family History:  family history is not on file. Depression Screen and Health Maintenance:    05/18/2024   10:16 AM  PHQ 2/9 Scores  PHQ - 2 Score 0   Health Maintenance  Topic Date Due   Hepatitis C Screening  Never done   DTaP/Tdap/Td (1 - Tdap) Never done   Colonoscopy  Never done   Pneumococcal Vaccine: 50+ Years (1 of 1 - PCV) Never done   Zoster Vaccines- Shingrix (1 of 2) Never done   Influenza Vaccine  Never done   COVID-19 Vaccine (1 - 2024-25 season) 06/03/2024 (Originally 04/23/2024)   HPV VACCINES  Aged Out   Meningococcal B Vaccine  Aged Out    There is no immunization history on file for this patient.   Objective   Physical Exam Abdominal:      Comments: Yellow area, where the pain was bad but not present or tender now.  Orange area where the tenderness was on exam today   Lateral abdomen pain worsened by pressure, alleviated by laying on the right side. Staying same over past 2 weeks.   BP 130/78   Pulse 78   Temp 98 F (36.7 C) (Temporal)   Ht 5' 8 (1.727 m)   Wt 213 lb 12.8 oz (97 kg)   PF 98 L/min   BMI 32.51 kg/m  Wt Readings from Last 10 Encounters:  05/18/24 213 lb 12.8 oz (97 kg)  01/14/17 182 lb (82.6 kg)  Vital signs reviewed.  Nursing notes reviewed. Weight trend reviewed. Abnormalities and problem-specific physical exam findings:  see images for where pain is, negative straight leg.  S/nd   but tender in pelvic region but not left flank. No guarding or  rebound. General Appearance:  Well developed, well nourished, well-groomed, healthy-appearing male with Body mass index is 32.51 kg/m. No acute distress appreciable.   Skin: Clear and well-hydrated. Pulmonary:  Normal work of breathing at rest, no respiratory distress apparent.    Musculoskeletal: He demonstrates smooth and coordinated movements throughout all major joints.All extremities are intact.  Neurological:  Awake, alert, oriented, and engaged.  No obvious focal neurological deficits or cognitive impairments.  Sensorium seems unclouded.  Psychiatric:  Appropriate mood, pleasant and cooperative demeanor, cheerful and engaged during the exam    Results for orders placed or performed in visit on 05/18/24  CBC with Differential/Platelet  Result Value Ref Range   WBC 3.9 (L) 4.0 - 10.5 K/uL   RBC 4.80 4.22 - 5.81 Mil/uL   Hemoglobin 12.6 (L)  13.0 - 17.0 g/dL   HCT 62.3 (L) 60.9 - 47.9 %   MCV 78.3 78.0 - 100.0 fl   MCHC 33.6 30.0 - 36.0 g/dL   RDW 86.3 88.4 - 84.4 %   Platelets 265.0 150.0 - 400.0 K/uL   Neutrophils Relative % 53.6 43.0 - 77.0 %   Lymphocytes Relative 31.9 12.0 - 46.0 %   Monocytes Relative 11.5 3.0 - 12.0 %   Eosinophils Relative 2.3 0.0 - 5.0 %   Basophils Relative 0.7 0.0 - 3.0 %   Neutro Abs 2.1 1.4 - 7.7 K/uL   Lymphs Abs 1.3 0.7 - 4.0 K/uL   Monocytes Absolute 0.5 0.1 - 1.0 K/uL   Eosinophils Absolute 0.1 0.0 - 0.7 K/uL   Basophils Absolute 0.0 0.0 - 0.1 K/uL  Comprehensive metabolic panel with GFR  Result Value Ref Range   Sodium 139 135 - 145 mEq/L   Potassium 4.0 3.5 - 5.1 mEq/L   Chloride 105 96 - 112 mEq/L   CO2 27 19 - 32 mEq/L   Glucose, Bld 107 (H) 70 - 99 mg/dL   BUN 17 6 - 23 mg/dL   Creatinine, Ser 9.04 0.40 - 1.50 mg/dL   Total Bilirubin 0.6 0.2 - 1.2 mg/dL   Alkaline Phosphatase 57 39 - 117 U/L   AST 13 0 - 37 U/L   ALT 11 0 - 53 U/L   Total Protein 7.0 6.0 - 8.3 g/dL   Albumin 4.2 3.5 - 5.2 g/dL   GFR 20.62 >39.99 mL/min    Calcium 8.9 8.4 - 10.5 mg/dL  Amylase  Result Value Ref Range   Amylase 26 (L) 27 - 131 U/L  Lipase  Result Value Ref Range   Lipase 58.0 11.0 - 59.0 U/L  Lipid panel  Result Value Ref Range   Cholesterol 136 0 - 200 mg/dL   Triglycerides 20.9 0.0 - 149.0 mg/dL   HDL 66.49 (L) >60.99 mg/dL   VLDL 84.1 0.0 - 59.9 mg/dL   LDL Cholesterol 86 0 - 99 mg/dL   Total CHOL/HDL Ratio 4    NonHDL 102.03   PSA  Result Value Ref Range   PSA 1.97 0.10 - 4.00 ng/mL    Office Visit on 05/18/2024  Component Date Value   WBC 05/18/2024 3.9 (L)    RBC 05/18/2024 4.80    Hemoglobin 05/18/2024 12.6 (L)    HCT 05/18/2024 37.6 (L)    MCV 05/18/2024 78.3    MCHC 05/18/2024 33.6    RDW 05/18/2024 13.6    Platelets 05/18/2024 265.0    Neutrophils Relative % 05/18/2024 53.6    Lymphocytes Relative 05/18/2024 31.9    Monocytes Relative 05/18/2024 11.5    Eosinophils Relative 05/18/2024 2.3    Basophils Relative 05/18/2024 0.7    Neutro Abs 05/18/2024 2.1    Lymphs Abs 05/18/2024 1.3    Monocytes Absolute 05/18/2024 0.5    Eosinophils Absolute 05/18/2024 0.1    Basophils Absolute 05/18/2024 0.0    Sodium 05/18/2024 139    Potassium 05/18/2024 4.0    Chloride 05/18/2024 105    CO2 05/18/2024 27    Glucose, Bld 05/18/2024 107 (H)    BUN 05/18/2024 17    Creatinine, Ser 05/18/2024 0.95    Total Bilirubin 05/18/2024 0.6    Alkaline Phosphatase 05/18/2024 57    AST 05/18/2024 13    ALT 05/18/2024 11    Total Protein 05/18/2024 7.0    Albumin 05/18/2024 4.2    GFR 05/18/2024 79.37  Calcium 05/18/2024 8.9    Amylase 05/18/2024 26 (L)    Lipase 05/18/2024 58.0    Cholesterol 05/18/2024 136    Triglycerides 05/18/2024 79.0    HDL 05/18/2024 33.50 (L)    VLDL 05/18/2024 15.8    LDL Cholesterol 05/18/2024 86    Total CHOL/HDL Ratio 05/18/2024 4    NonHDL 05/18/2024 102.03    PSA 05/18/2024 1.97      ASSESSMENT & PLAN   Assessment & Plan Left flank pain Left flank pain and chronic  back pain   He has experienced left flank pain for two weeks, continuous and tolerable, without radiation to the groin. Chronic back pain has persisted for four to five weeks. Differential diagnosis includes diverticular disease and nephrolithiasis. The pain is not aggravated by movement or palpation, suggesting a non-musculoskeletal cause. There is no fever or systemic symptoms, and pain severity ranges from 3 to 6 out of 10. A ruptured diverticula is considered, but no fever or systemic signs of infection are present. Order a CT scan of the abdomen and pelvis with contrast to evaluate for diverticular disease or nephrolithiasis, with a stat order to expedite. Order blood work including pancreas and kidney function tests, and an x-ray of the abdomen and spine. Prescribe prescription strength ibuprofen  and tramadol  for pain management. Advise him to go to the ER if pain becomes severe. Benign prostatic hyperplasia with weak urinary stream Patient presents with chest pain and shortness of breath. Vitals: BP 140/90, HR 95, O2 sat 94%. EKG shows ST elevation. Hypertension, unspecified type Long-standing hypertension is managed with amlodipine . 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 No changes in bowel habits are reported in relation to current symptoms. Diverticular disease Diverticular disease was noted during [place] less than ten years ago. Current symptoms of left flank pain may be related to diverticular disease, possibly a ruptured diverticula, although no fever or systemic signs of infection are present. Lumbar back pain Shared decision-making done; patient understood risks and agreed to X-ray.  Routine health maintenance includes checking cholesterol levels. Check cholesterol level during blood work.   ORDER ASSOCIATIONS  #    DIAGNOSIS / CONDITION ICD-10 ENCOUNTER ORDER     ICD-10-CM   1. Left flank pain  R10.9 CBC with Differential/Platelet    Comprehensive metabolic panel with GFR    CT ABDOMEN PELVIS W CONTRAST    DG Abd 1 View    Amylase    Lipase    ibuprofen  (ADVIL ) 800 MG tablet    traMADol  (ULTRAM ) 50 MG tablet    2. Benign prostatic hyperplasia with weak urinary stream  N40.1 PSA   R39.12     3. Hypertension, unspecified type  I10     4. 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 Chronic constipation  K59.09     6. Diverticular disease  K57.90     7. Lumbar back pain  M54.50 DG Lumbar Spine Complete     Diagnoses and all orders for this visit: Left flank pain -     CBC with Differential/Platelet -     Comprehensive metabolic panel with GFR -     CT ABDOMEN PELVIS W CONTRAST; Future -     DG Abd 1 View; Future -     Amylase -     Lipase -     ibuprofen  (ADVIL )  800 MG tablet; Take 1 tablet (800 mg total) by mouth every 8 (eight) hours as needed. -     traMADol  (ULTRAM ) 50 MG tablet; Take 1 tablet (50 mg total) by mouth every 8 (eight) hours as needed for up to 5 days. 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    Additional Info: This encounter employed state-of-the-art, real-time, collaborative documentation. The patient actively reviewed and assisted in  updating their electronic medical record on a shared screen, ensuring transparency and facilitating joint problem-solving for the problem list, overview, and plan. This approach promotes accurate, informed care. The treatment plan was discussed and reviewed in detail, including medication safety, potential side effects, and all patient questions. We confirmed understanding and comfort with the plan. Follow-up instructions were established, including contacting the office for any concerns, returning if symptoms worsen, persist, or new symptoms develop, and precautions for potential emergency department visits.  Initial Appointment Goals:  This initial visit focused on establishing a foundation for the patient's care. We collaboratively reviewed his medical history and medications in detail, updating the chart as shown in the encounter. Given the extensive information, we prioritized addressing his most pressing concerns, which he reported were: New Pt (Pt is present to est care ) and Back Pain (Pt has been having a lot of back pain and left side pain 2 weeks )  While the complexity of the patient's medical picture may necessitate further evaluation in subsequent visits, we were able to develop a preliminary care plan together. To expedite a comprehensive plan at the next visit, we encouraged the patient to gather relevant medical records from previous providers. This collaborative approach will ensure a more complete understanding of the patient's health and inform the development of a personalized care plan. We look forward to continuing the conversation and working together with the patient on achieving his health goals.   Collaborative Documentation:  Today's encounter utilized real-time, dynamic patient engagement.  Patients actively participate by directly reviewing and assisting in updating their medical records through a shared screen. This transparency empowers patients to visually confirm chart updates  made by the healthcare provider.  This collaborative approach facilitates problem management as we jointly update the problem list, problem overview, and assessment/plan. Ultimately, this process enhances chart accuracy and completeness, fostering shared decision-making, patient education, and informed consent for tests and treatments.  Collaborative Treatment Planning:  Treatment plans were discussed and reviewed in detail.  Explained medication safety and potential side effects.  Encouraged participation and answered all patient questions, confirming understanding and comfort with the plan. Encouraged patient to contact our office if they have any questions or concerns. Agreed on patient returning to office if symptoms worsen, persist, or new symptoms develop.  ----------------------------------------------------- Bernardino MATSU Jesus, MD  05/18/2024 6:04 PM  [facility]:  [phone]

## 2024-05-18 NOTE — Assessment & Plan Note (Signed)
 Patient presents with chest pain and shortness of breath. Vitals: BP 140/90, HR 95, O2 sat 94%. EKG shows ST elevation.

## 2024-05-18 NOTE — Assessment & Plan Note (Signed)
Updated problem overview for this problem to improve longitudinal management  

## 2024-05-20 ENCOUNTER — Ambulatory Visit: Payer: Self-pay | Admitting: Internal Medicine

## 2024-05-20 DIAGNOSIS — M545 Low back pain, unspecified: Secondary | ICD-10-CM

## 2024-05-21 NOTE — Telephone Encounter (Signed)
 read by Sherrod Alcon at 8:55PM on 05/20/2024.

## 2024-05-23 ENCOUNTER — Telehealth: Payer: Self-pay | Admitting: Radiology

## 2024-05-23 NOTE — Telephone Encounter (Signed)
 Abdominal X-ray mark to be reviewed today.

## 2024-05-23 NOTE — Telephone Encounter (Signed)
 Lumbar spine X-ray mark to be reviewed today.

## 2024-05-28 ENCOUNTER — Other Ambulatory Visit (HOSPITAL_BASED_OUTPATIENT_CLINIC_OR_DEPARTMENT_OTHER): Payer: Self-pay

## 2024-05-30 ENCOUNTER — Telehealth: Payer: Self-pay

## 2024-05-30 ENCOUNTER — Ambulatory Visit (INDEPENDENT_AMBULATORY_CARE_PROVIDER_SITE_OTHER): Payer: Self-pay | Admitting: Internal Medicine

## 2024-05-30 ENCOUNTER — Other Ambulatory Visit (HOSPITAL_BASED_OUTPATIENT_CLINIC_OR_DEPARTMENT_OTHER): Payer: Self-pay

## 2024-05-30 ENCOUNTER — Encounter: Payer: Self-pay | Admitting: Internal Medicine

## 2024-05-30 VITALS — BP 132/76 | HR 61 | Temp 97.9°F | Ht 68.0 in | Wt 213.6 lb

## 2024-05-30 DIAGNOSIS — R351 Nocturia: Secondary | ICD-10-CM

## 2024-05-30 DIAGNOSIS — K5790 Diverticulosis of intestine, part unspecified, without perforation or abscess without bleeding: Secondary | ICD-10-CM

## 2024-05-30 DIAGNOSIS — E786 Lipoprotein deficiency: Secondary | ICD-10-CM

## 2024-05-30 DIAGNOSIS — R1032 Left lower quadrant pain: Secondary | ICD-10-CM | POA: Diagnosis not present

## 2024-05-30 DIAGNOSIS — K5909 Other constipation: Secondary | ICD-10-CM

## 2024-05-30 DIAGNOSIS — D509 Iron deficiency anemia, unspecified: Secondary | ICD-10-CM

## 2024-05-30 DIAGNOSIS — R10A2 Flank pain, left side: Secondary | ICD-10-CM

## 2024-05-30 MED ORDER — POLYETHYLENE GLYCOL 3350 17 G PO PACK
34.0000 g | PACK | Freq: Every day | ORAL | 0 refills | Status: AC
  Filled 2024-05-30: qty 14, 7d supply, fill #0

## 2024-05-30 MED ORDER — TAMSULOSIN HCL 0.4 MG PO CAPS
0.4000 mg | ORAL_CAPSULE | Freq: Every day | ORAL | 3 refills | Status: AC
  Filled 2024-05-30: qty 30, 30d supply, fill #0

## 2024-05-30 NOTE — Assessment & Plan Note (Signed)
 Low HDL cholesterol is noted. Encourage a high fiber diet to improve HDL cholesterol levels.

## 2024-05-30 NOTE — Assessment & Plan Note (Signed)
 Borderline iron deficiency anemia   Borderline anemia with small MCV suggests possible iron deficiency. Iron levels were not checked during this visit but will be assessed in future blood work. Check iron levels during the next blood work.

## 2024-05-30 NOTE — Assessment & Plan Note (Signed)
 Left flank pain with chronic constipation and diverticular disease of colon   Left flank pain persists, possibly due to kidney stone or diverticular disease. An X-ray showed no kidney stone, but a CT scan is needed for confirmation. Constipation may contribute to diverticular pain. Reorder a CT scan of the abdomen and pelvis with and without contrast to evaluate for kidney stones and diverticular disease. The decision to use contrast will depend on urinalysis results, with noncontrast preferred if blood is present in urine. Encourage a high fiber diet to manage constipation and diverticular disease. Prescribe Miralax 34 grams daily to alleviate constipation. Advise use of a Squatty Potty to reduce straining during bowel movements. Avoid cheese to prevent constipation. Refer to a GI specialist for diverticular disease management.

## 2024-05-30 NOTE — Assessment & Plan Note (Signed)
 Benign prostatic hyperplasia with lower urinary tract symptoms and nocturia   Nocturia is likely due to benign prostatic hyperplasia. PSA levels suggest no prostate cancer. Flomax was prescribed previously but not picked up. Send the Flomax prescription again for management of BPH and nocturia. Refer to a urologist for evaluation and management of BPH and nocturia.

## 2024-05-30 NOTE — Telephone Encounter (Signed)
 Copied from CRM [id number]. Topic: General - Other >> May 30, 2024 12:22 PM Alfonso HERO wrote: Reason for CRM: imaging called stating that the NEXT part of the patients medical coverage is OON and the patient needs to be referred out to another provider.

## 2024-05-30 NOTE — Progress Notes (Signed)
 ==============================  [facility]: [phone]   -- Medical Office Visit --  Patient: Alex Villanueva      Age: 73 y.o.       Sex:  male  Date:   05/30/2024 Today's Healthcare Provider: Bernardino KANDICE Cone, MD  ==============================   Chief Complaint: Back Pain (Pt is following up on back pain and side pain as well.)   Discussed the use of AI scribe software for clinical note transcription with the patient, who gave verbal consent to proceed.  History of Present Illness 73 year old male with chronic constipation who presents with left flank pain. 1 month follow up same pain today.  XR found no stone, no clear back problem to explain. Labs show good kidney function but no urinalysis available.  He experiences left flank pain, which has improved but remains present. The pain is less severe than before, and he has not taken any medication for it recently. A previous x-ray of the back and abdomen did not reveal kidney stones, but a CT scan is pending to further investigate the possibility of kidney stones or diverticulitis.  He has a long-standing history of chronic constipation for over forty years. He uses laxatives regularly, including bisacodyl daily, and is considering adding Miralax to his regimen. He believes his digestive system is 'lazy'. Previous colonoscopies did not reveal any abnormalities, and [place]  He experiences nocturia, waking up more than twice a night to urinate, which disrupts his sleep. [place] has [place]static [place] (BPH) [place] and has been on medication for it. He has not yet tried Flomax, which was prescribed last month.  Recent blood work showed low HDL cholesterol and borderline anemia with a small MCV. He has not had his iron levels checked recently.   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 Past Medical History:  has a past medical history of Hypertension. Past Surgical History:   has a past surgical history that includes Neck surgery and Carpal tunnel release (Right, 2023). Social History:   reports that he has never smoked. He has never used smokeless tobacco. He reports that he does not drink alcohol and does not use drugs. Family History:  family history is not on file. Allergies:  has no known allergies.   Medication Reconciliation: Current Outpatient Medications on File Prior to Visit  Medication Sig   amLODipine  (NORVASC) 5 MG tablet Take 5 mg by mouth daily.   bisacodyl (DULCOLAX) 5 MG EC tablet Take 5 mg by mouth daily as needed for moderate constipation.   ibuprofen  (ADVIL ) 800 MG tablet Take 1 tablet (800 mg total) by mouth every 8 (eight) hours as needed.   No current facility-administered medications on file prior to visit.   Medications Discontinued During This Encounter  Medication Reason   tamsulosin (FLOMAX) 0.4 MG CAPS capsule Completed Course     Physical Exam:    05/30/2024   11:10 AM 05/18/2024   10:05 AM 06/26/2019    6:44 PM  Vitals with BMI  Height 5' 8 5' 8   Weight 213 lbs 10 oz 213 lbs 13 oz   BMI 32.49 32.52   Systolic 132 130 814  Diastolic 76 78 83  Pulse 61 78 65  Vital signs reviewed.  Nursing notes reviewed. Weight trend reviewed. Physical Activity: Not on file   General Appearance:  No acute distress appreciable.   Well-groomed, healthy-appearing male.  Well proportioned with no abnormal fat distribution.  Good muscle tone. Pulmonary:  Normal work of breathing at rest, no respiratory distress apparent. SpO2: 98 %  Musculoskeletal: All extremities are intact.  Neurological:  Awake, alert, oriented, and  engaged.  No obvious focal neurological deficits or cognitive impairments.  Sensorium seems unclouded.   Speech is clear and coherent with logical content. Psychiatric:  Appropriate mood, pleasant and cooperative demeanor, thoughtful and engaged during the exam   Verbalized to patient: Physical Exam ABDOMEN: no clear tenderness anywhere in abdomen even with deep palpation  daughter helps with translations.  Results LABS HDL: Low Amylase: Low Glucose: High Blood count: Borderline anemia, microcytosis GFR: 79  RADIOLOGY Abdominal X-ray: No nephrolithiasis, constipation present     05/18/2024   10:16 AM  PHQ 2/9 Scores  PHQ - 2 Score 0    {   No results found for any visits on 05/30/24.} Office Visit on 05/18/2024  Component Date Value Ref Range Status   WBC 05/18/2024 3.9 (L)  4.0 - 10.5 K/uL Final   RBC 05/18/2024 4.80  4.22 - 5.81 Mil/uL Final   Hemoglobin 05/18/2024 12.6 (L)  13.0 - 17.0 g/dL Final   HCT 90/73/7974 37.6 (L)  39.0 - 52.0 % Final   MCV 05/18/2024 78.3  78.0 - 100.0 fl Final   MCHC 05/18/2024 33.6  30.0 - 36.0 g/dL Final   RDW 90/73/7974 13.6  11.5 - 15.5 % Final   Platelets 05/18/2024 265.0  150.0 - 400.0 K/uL Final   Neutrophils Relative % 05/18/2024 53.6  43.0 - 77.0 % Final   Lymphocytes Relative 05/18/2024 31.9  12.0 - 46.0 % Final   Monocytes Relative 05/18/2024 11.5  3.0 - 12.0 % Final   Eosinophils Relative 05/18/2024 2.3  0.0 - 5.0 % Final   Basophils Relative 05/18/2024 0.7  0.0 - 3.0 % Final   Neutro Abs 05/18/2024 2.1  1.4 - 7.7 K/uL Final   Lymphs Abs 05/18/2024 1.3  0.7 - 4.0 K/uL Final   Monocytes Absolute 05/18/2024 0.5  0.1 - 1.0 K/uL Final   Eosinophils Absolute 05/18/2024 0.1  0.0 - 0.7 K/uL Final   Basophils Absolute 05/18/2024 0.0  0.0 - 0.1 K/uL Final   Sodium 05/18/2024 139  135 - 145 mEq/L Final   Potassium 05/18/2024 4.0  3.5 - 5.1 mEq/L Final   Chloride 05/18/2024 105  96 - 112 mEq/L Final   CO2 05/18/2024 27  19 - 32 mEq/L  Final   Glucose, Bld 05/18/2024 107 (H)  70 - 99 mg/dL Final   BUN 90/73/7974 17  6 - 23 mg/dL Final   Creatinine, Ser 05/18/2024 0.95  0.40 - 1.50 mg/dL Final   Total Bilirubin 05/18/2024 0.6  0.2 - 1.2 mg/dL Final   Alkaline Phosphatase 05/18/2024 57  39 - 117 U/L Final   AST 05/18/2024 13  0 - 37 U/L Final   ALT 05/18/2024 11  0 - 53 U/L Final   Total Protein 05/18/2024 7.0  6.0 - 8.3 g/dL Final   Albumin 90/73/7974 4.2  3.5 - 5.2 g/dL Final   GFR 90/73/7974 79.37  >60.00 mL/min Final   Calcium 05/18/2024 8.9  8.4 - 10.5 mg/dL Final   Amylase 90/73/7974 26 (L)  27 - 131 U/L Final  Lipase 05/18/2024 58.0  11.0 - 59.0 U/L Final   Cholesterol 05/18/2024 136  0 - 200 mg/dL Final   Triglycerides 90/73/7974 79.0  0.0 - 149.0 mg/dL Final   HDL 90/73/7974 33.50 (L)  >60.99 mg/dL Final   VLDL 90/73/7974 15.8  0.0 - 40.0 mg/dL Final   LDL Cholesterol 05/18/2024 86  0 - 99 mg/dL Final   Total CHOL/HDL Ratio 05/18/2024 4   Final   NonHDL 05/18/2024 102.03   Final   PSA 05/18/2024 1.97  0.10 - 4.00 ng/mL Final  No image results found. DG Abd 1 View Result Date: 05/23/2024 CLINICAL DATA:  Left-sided abdominal pain. EXAM: ABDOMEN - 1 VIEW COMPARISON:  None Available. FINDINGS: Stool is seen in the majority of the colon. No small bowel dilatation. No unexpected radiopaque calculi. Phleboliths in the anatomic pelvis. IMPRESSION: Moderate stool burden. Electronically Signed   By: Newell Eke M.D.   On: 05/23/2024 15:00   DG Lumbar Spine Complete Result Date: 05/23/2024 CLINICAL DATA:  Left-sided back pain without radiculopathy. EXAM: LUMBAR SPINE - COMPLETE 4+ VIEW COMPARISON:  None Available. FINDINGS: Alignment is anatomic. Vertebral body and disc space heights are maintained with exception of L5-S1, where there is slight narrowing. Mild multilevel anterior marginal osteophytosis. No definite pars defects. IMPRESSION: 1. Mild loss of disc space height at L5-S1. 2. Mild multilevel anterior marginal  osteophytosis. Electronically Signed   By: Newell Eke M.D.   On: 05/23/2024 14:59         ASSESSMENT & PLAN   Assessment & Plan Left flank pain Left lower quadrant abdominal pain Diverticular disease Chronic constipation Left flank pain with chronic constipation and diverticular disease of colon   Left flank pain persists, possibly due to kidney stone or diverticular disease. An X-ray showed no kidney stone, but a CT scan is needed for confirmation. Constipation may contribute to diverticular pain. Reorder a CT scan of the abdomen and pelvis with and without contrast to evaluate for kidney stones and diverticular disease. The decision to use contrast will depend on urinalysis results, with noncontrast preferred if blood is present in urine. Encourage a high fiber diet to manage constipation and diverticular disease. Prescribe Miralax 34 grams daily to alleviate constipation. Advise use of a Squatty Potty to reduce straining during bowel movements. Avoid cheese to prevent constipation. Refer to a GI specialist for diverticular disease management. Nocturia Benign prostatic hyperplasia with lower urinary tract symptoms and nocturia   Nocturia is likely due to benign prostatic hyperplasia. PSA levels suggest no prostate cancer. Flomax was prescribed previously but not picked up. Send the Flomax prescription again for management of BPH and nocturia. Refer to a urologist for evaluation and management of BPH and nocturia. Iron deficiency anemia, unspecified iron deficiency anemia type Borderline iron deficiency anemia   Borderline anemia with small MCV suggests possible iron deficiency. Iron levels were not checked during this visit but will be assessed in future blood work. Check iron levels during the next blood work. Low HDL (under 40) Low HDL cholesterol is noted. Encourage a high fiber diet to improve HDL cholesterol levels.    ORDER ASSOCIATIONS  #   DIAGNOSIS / CONDITION ICD-10  ENCOUNTER ORDER     ICD-10-CM   1. Left flank pain  R10.A2 Urinalysis w microscopic + reflex cultur    CT ABDOMEN PELVIS WO CONTRAST    2. Diverticular disease  K57.90 CT ABDOMEN PELVIS WO CONTRAST    polyethylene glycol (MIRALAX) 17 g packet  3. Chronic constipation  K59.09     4. Nocturia  R35.1 Urinalysis w microscopic + reflex cultur    Ambulatory referral to Urology    tamsulosin (FLOMAX) 0.4 MG CAPS capsule    5. Left lower quadrant abdominal pain  R10.32 CT ABDOMEN PELVIS WO CONTRAST           Orders Placed in Encounter:   Lab Orders         Urinalysis w microscopic + reflex cultur    Imaging Orders         CT ABDOMEN PELVIS WO CONTRAST    Referral Orders         Ambulatory referral to Urology    Meds ordered this encounter  Medications   tamsulosin (FLOMAX) 0.4 MG CAPS capsule    Sig: Take 1 capsule (0.4 mg total) by mouth daily.    Dispense:  30 capsule    Refill:  3   polyethylene glycol (MIRALAX) 17 g packet    Sig: Take 34 g ( 2 packets) by mouth daily.    Dispense:  14 each    Refill:  0    CT ABDOMEN PELVIS WO CONTRAST       Comments: ### Staged CT Imaging Order     Please perform a **noncontrast CT of the abdomen and pelvis (stone protocol)** for evaluation of suspected nephrolithiasis and acute flank pain.      - **Request a preliminary (wet) read by radiology** for the presence of renal or ureteral calculi.      - If **no stone is detected on the preliminary read**, proceed immediately to **contrast-enhanced CT of the abdomen and pelvis** to evaluate for alternative etiologies of abdominal/flank pain.      Clinical rationale:      - Noncontrast CT is the reference standard for detecting urinary tract calculi, with sensitivity and specificity near 97-100%.[1][2][3]      - IV contrast may obscure small stones and is not recommended as first-line for stone detection.[1][4]      - If stone disease is excluded, contrast-enhanced CT provides  additional diagnostic information for other causes of pain, such as infection, malignancy, or bowel pathology.[1][4]      - This staged protocol minimizes unnecessary contrast exposure and optimizes diagnostic yield in accordance with ACR Appropriateness Criteria.[1][4][3]      Indication: Acute left flank pain, suspicion for nephrolithiasis, constipation, equivocal X-ray, GFR ~70.      ### References  1. ACR Appropriateness Criteria Acute Onset Flank Pain-Suspicion of Stone Disease (Urolithiasis). Charlanne RT, Therisa MARLA Leotis KANDICE, et al. Journal of the [employer] of Radiology : [place]. 2023;20(11S):S315-S328. doi:10.1016/j.jacr.2023.08.020. 2. CT of the Urinary Tract Revisited. Tsili AC, Varkarakis I, Pasoglou V, Anagnostou N, Argyropoulou MI. European Journal of Radiology. 2023;160:110717. doi:10.1016/j.ejrad.2023.110717. 3. ACR Appropriateness Criteria Left Lower Quadrant Pain: 2023 Update. Emi GORMAN Cramp DH, Ashley GRASS, et al. Journal of the [employer] of Radiology : [place]. 2023;20(11S):S471-S480. doi:10.1016/j.jacr.2023.08.013. 4. ACR Appropriateness Criteria Acute Pelvic Pain in the Reproductive Age Group: 2023 Update. Brook OR, Dadour JR, Silver JB, et al. Journal of the [employer] of Radiology : [place]. 2024;21(6S):S3-S20. doi:10.1016/j.jacr.2024.02.014.    polyethylene glycol (MIRALAX) 17 g packet  Daily               This document was synthesized by artificial intelligence (Abridge) using HIPAA-compliant recording of the clinical interaction;   We discussed the use of AI scribe software for clinical note transcription with the patient, who gave verbal  consent to proceed. additional Info: This encounter employed state-of-the-art, real-time, collaborative documentation. The patient actively reviewed and assisted in updating their electronic medical record on a shared screen, ensuring transparency and facilitating joint problem-solving for the problem list, overview, and plan.  This approach promotes accurate, informed care. The treatment plan was discussed and reviewed in detail, including medication safety, potential side effects, and all patient questions. We confirmed understanding and comfort with the plan. Follow-up instructions were established, including contacting the office for any concerns, returning if symptoms worsen, persist, or new symptoms develop, and precautions for potential emergency department visits.

## 2024-05-30 NOTE — Patient Instructions (Addendum)
 It was a pleasure seeing you today! Your health and satisfaction are our top priorities.  Alex Cone, MD  VISIT SUMMARY: During your visit, we discussed your ongoing left flank pain, chronic constipation, and other health concerns. We reviewed your recent symptoms, past medical history, and current medications. We also planned further diagnostic tests and adjustments to your treatment plan to address your conditions.  YOUR PLAN: -LEFT FLANK PAIN WITH CHRONIC CONSTIPATION AND DIVERTICULAR DISEASE OF COLON: Your left flank pain may be due to a kidney stone or diverticular disease. We will perform a CT scan of your abdomen and pelvis to investigate further. To help manage your constipation and diverticular disease, you should follow a high fiber diet, take Miralax 34 grams daily, and use a Squatty Potty to reduce straining during bowel movements. Avoid cheese as it can worsen constipation. You will also be referred to a gastrointestinal specialist for further management of your diverticular disease.  -BENIGN PROSTATIC HYPERPLASIA WITH LOWER URINARY TRACT SYMPTOMS AND NOCTURIA: Your frequent nighttime urination is likely due to benign prostatic hyperplasia (BPH), which is an enlargement of the prostate gland. We will resend your prescription for Flomax to help manage your symptoms. You will also be referred to a urologist for further evaluation and management of BPH and nocturia.  -BORDERLINE IRON DEFICIENCY ANEMIA: Your blood work showed borderline anemia, which may be due to low iron levels. We will check your iron levels during your next blood work to confirm this.  -LOW HDL CHOLESTEROL: Your blood work showed low HDL cholesterol, which is the 'good' cholesterol. To help improve your HDL levels, you should follow a high fiber diet.  INSTRUCTIONS: Please follow up with the CT scan of your abdomen and pelvis as ordered. Start taking Miralax 34 grams daily and use a Squatty Potty to help with your  bowel movements. Avoid cheese to prevent constipation. Pick up and start taking Flomax as prescribed. Schedule appointments with a gastrointestinal specialist and a urologist for further evaluation and management of your conditions. We will check your iron levels during your next blood work. Continue following a high fiber diet to help with your cholesterol and digestive health.  Your Providers PCP: Villanueva Alex MATSU, MD,  [phone]) Referring Provider: Cone Alex MATSU, MD,  [phone])  NEXT STEPS: [x]  Early Intervention: Schedule sooner appointment, call our on-call services, or go to emergency room if there is any significant Increase in pain or discomfort New or worsening symptoms Sudden or severe changes in your health [x]  Flexible Follow-Up: We recommend a Return in about 1 month (around 06/30/2024) for chronic disease monitoring and management. for optimal routine care. This allows for progress monitoring and treatment adjustments. [x]  Preventive Care: Schedule your annual preventive care visit! It's typically covered by insurance and helps identify potential health issues early. [x]  Lab & X-ray Appointments: Incomplete tests scheduled today, or call to schedule. X-rays: [facility] (M-F, 8:30am-noon or 1pm-5pm). [x]  Medical Information Release: Sign a release form at front desk to obtain relevant medical information we don't have.  MAKING THE MOST OF OUR FOCUSED 20 MINUTE APPOINTMENTS: [x]   Clearly state your top concerns at the beginning of the visit to focus our discussion [x]   If you anticipate you will need more time, please inform the front desk during scheduling - we can book multiple appointments in the same week. [x]   If you have transportation problems- use our convenient video appointments or ask about transportation support. [x]   We can get down  to business faster if you use MyChart to update information before the visit and submit non-urgent questions  before your visit. Thank you for taking the time to provide details through MyChart.  Let our nurse know and she can import this information into your encounter documents.  Arrival and Wait Times: [x]   Arriving on time ensures that everyone receives prompt attention. [x]   Early morning (8a) and afternoon (1p) appointments tend to have shortest wait times. [x]   Unfortunately, we cannot delay appointments for late arrivals or hold slots during phone calls.  Getting Answers and Following Up [x]   Simple Questions & Concerns: For quick questions or basic follow-up after your visit, reach us  at [phone] or MyChart messaging. [x]   Complex Concerns: If your concern is more complex, scheduling an appointment might be best. Discuss this with the staff to find the most suitable option. [x]   Lab & Imaging Results: We'll contact you directly if results are abnormal or you don't use MyChart. Most normal results will be on MyChart within 2-3 business days, with a review message from Dr. Jesus. Haven't heard back in 2 weeks? Need results sooner? Contact us  at [phone]. [x]   Referrals: Our referral coordinator will manage specialist referrals. The specialist's office should contact you within 2 weeks to schedule an appointment. Call us  if you haven't heard from them after 2 weeks.  Staying Connected [x]   MyChart: Activate your MyChart for the fastest way to access results and message us . See the last page of this paperwork for instructions on how to activate.  Bring to Your Next Appointment [x]   Medications: Please bring all your medication bottles to your next appointment to ensure we have an accurate record of your prescriptions. [x]   Health Diaries: If you're monitoring any health conditions at home, keeping a diary of your readings can be very helpful for discussions at your next appointment.  Billing [x]   X-ray & Lab Orders: These are billed by separate companies. Contact the invoicing  company directly for questions or concerns. [x]   Visit Charges: Discuss any billing inquiries with our administrative services team.  Your Satisfaction Matters [x]   Share Your Experience: We strive for your satisfaction! If you have any complaints, or preferably compliments, please let Dr. Jesus know directly or contact our Practice Administrators, Manuelita Rubin or [employer], by asking at the front desk.   Reviewing Your Records [x]   Review this early draft of your clinical encounter notes below and the final encounter summary tomorrow on MyChart after its been completed.  All orders placed so far are visible here: Left flank pain -     Urinalysis w microscopic + reflex cultur -     CT ABDOMEN PELVIS WO CONTRAST; Future  Diverticular disease -     CT ABDOMEN PELVIS WO CONTRAST; Future -     Polyethylene Glycol 3350; Take 34 g ( 2 packets) by mouth daily.  Dispense: 14 each; Refill: 0  Chronic constipation  Nocturia -     Urinalysis w microscopic + reflex cultur -     Ambulatory referral to Urology -     Tamsulosin HCl; Take 1 capsule (0.4 mg total) by mouth daily.  Dispense: 30 capsule; Refill: 3  Left lower quadrant abdominal pain -     CT ABDOMEN PELVIS WO CONTRAST; Future  Iron deficiency anemia, unspecified iron deficiency anemia type  Low HDL (under 40)

## 2024-05-31 LAB — URINALYSIS W MICROSCOPIC + REFLEX CULTURE
Bacteria, UA: NONE SEEN /HPF
Bilirubin Urine: NEGATIVE
Glucose, UA: NEGATIVE
Hyaline Cast: NONE SEEN /LPF
Leukocyte Esterase: NEGATIVE
Nitrites, Initial: NEGATIVE
Protein, ur: NEGATIVE
Specific Gravity, Urine: 1.022 (ref 1.001–1.035)
Squamous Epithelial / HPF: NONE SEEN /HPF (ref <=5)
WBC, UA: NONE SEEN /HPF (ref 0–5)
pH: 5.5 (ref 5.0–8.0)

## 2024-05-31 LAB — NO CULTURE INDICATED

## 2024-06-02 ENCOUNTER — Ambulatory Visit (HOSPITAL_BASED_OUTPATIENT_CLINIC_OR_DEPARTMENT_OTHER): Admission: RE | Admit: 2024-06-02 | Source: Ambulatory Visit

## 2024-06-02 ENCOUNTER — Ambulatory Visit: Payer: Self-pay | Admitting: Internal Medicine

## 2024-06-02 DIAGNOSIS — R3129 Other microscopic hematuria: Secondary | ICD-10-CM | POA: Insufficient documentation

## 2024-06-02 DIAGNOSIS — K5792 Diverticulitis of intestine, part unspecified, without perforation or abscess without bleeding: Secondary | ICD-10-CM

## 2024-06-11 ENCOUNTER — Ambulatory Visit (HOSPITAL_BASED_OUTPATIENT_CLINIC_OR_DEPARTMENT_OTHER)
Admission: RE | Admit: 2024-06-11 | Discharge: 2024-06-11 | Disposition: A | Discharge status: Home / Self Care | Source: Ambulatory Visit | Attending: Internal Medicine | Admitting: Internal Medicine

## 2024-06-11 DIAGNOSIS — K5790 Diverticulosis of intestine, part unspecified, without perforation or abscess without bleeding: Secondary | ICD-10-CM | POA: Diagnosis present

## 2024-06-11 DIAGNOSIS — R1032 Left lower quadrant pain: Secondary | ICD-10-CM | POA: Diagnosis present

## 2024-06-11 DIAGNOSIS — R10A2 Flank pain, left side: Secondary | ICD-10-CM | POA: Insufficient documentation

## 2024-06-13 ENCOUNTER — Other Ambulatory Visit (HOSPITAL_BASED_OUTPATIENT_CLINIC_OR_DEPARTMENT_OTHER): Payer: Self-pay

## 2024-06-13 ENCOUNTER — Telehealth: Payer: Self-pay | Admitting: *Deleted

## 2024-06-13 MED ORDER — AMOXICILLIN-POT CLAVULANATE 875-125 MG PO TABS
1.0000 | ORAL_TABLET | Freq: Two times a day (BID) | ORAL | 0 refills | Status: AC
  Filled 2024-06-13: qty 14, 7d supply, fill #0

## 2024-06-13 NOTE — Progress Notes (Signed)
 read by Sherrod Alcon at 8:36AM on 06/13/2024

## 2024-06-13 NOTE — Progress Notes (Signed)
 Antibiotic Selection, Dose, and Duration for Acute Uncomplicated Diverticulitis (per 2023-2024 Guidelines): Clinical Summary Diagnosis: Acute uncomplicated diverticulitis (CT confirmed, mild stranding, no abscess, no perforation, no obstruction) Patient: 73 year old male with comorbidities (HTN, BPH, obesity, chronic constipation, anemia), GFR >60, no drug allergies, no immunosuppression, clinically stable, no peritonitis. Latest Guideline Recommendations (ACG/AGA/ACP 2022-2024): Antibiotics are NOT routinely required for immunocompetent outpatients with mild, uncomplicated diverticulitis.  Consider antibiotics if: age >69, significant comorbidities, immunosuppression, severe symptoms, or lack of clinical improvement. This patient: Age 74, mild anemia, obesity, but clinically stable, no immunosuppression, no severe symptoms, no systemic toxicity.  Decision-Making: Given age >94 and comorbidities, antibiotics are reasonable. since opting for antibiotics, use oral agents targeting Gram-negative and anaerobic organisms. First-Line Oral Antibiotic Regimens (per ACG/AGA/ACP & [facility] Guide): Amoxicillin -clavulanate (Augmentin): 875 mg/125 mg PO BID x 7 days OR Ciprofloxacin 500 mg PO BID + Metronidazole 500 mg PO TID x 7 days (Use only if Augmentin contraindicated or not tolerated) Duration: 7 days (short course, per 2022-2023 guidelines) Renal function: GFR >60, no adjustment needed. No known allergies. Monitor for side effects (especially with fluoroquinolones in elderly).  Orders/Documentation Medication Order: Amoxicillin -clavulanate 875 mg/125 mg tablet, 1 tablet PO BID x 7 days. Dispense: 14 tablets. No refills. Diagnosis/Assessment: Acute uncomplicated diverticulitis of the sigmoid/descending colon (K57.32) Treatment per 2022-2024 ACG/AGA/ACP guidelines  References: ACG Clinical Guideline: Management of Acute Left-Sided Colonic Diverticulitis (2022) AGA Clinical Practice Update:  Outpatient Diverticulitis (2022) ACP Practice Guideline: Management of Acute Uncomplicated Diverticulitis (2022) [facility] Guide to Antimicrobial Therapy (2024)

## 2024-06-13 NOTE — Telephone Encounter (Signed)
 Sherral form radiology call with CT Abdominal  Results= uncomplicated diverticulitis  Please advise

## 2024-06-20 ENCOUNTER — Ambulatory Visit: Payer: Self-pay | Admitting: Internal Medicine

## 2024-06-20 ENCOUNTER — Telehealth: Payer: Self-pay

## 2024-06-20 NOTE — Telephone Encounter (Signed)
 After pt started abx pt is doing better spoke with via phone with daughter

## 2024-07-03 ENCOUNTER — Ambulatory Visit: Admitting: Internal Medicine
